# Patient Record
Sex: Female | Born: 1955 | Race: White | Hispanic: No | Marital: Single | Smoking: Never smoker
Health system: Southern US, Community
[De-identification: ages and names within clinical notes are randomized; demographics above are authoritative.]

## PROBLEM LIST (undated history)

## (undated) DIAGNOSIS — T7840XA Allergy, unspecified, initial encounter: Secondary | ICD-10-CM

## (undated) DIAGNOSIS — Z8739 Personal history of other diseases of the musculoskeletal system and connective tissue: Secondary | ICD-10-CM

## (undated) DIAGNOSIS — Z86018 Personal history of other benign neoplasm: Secondary | ICD-10-CM

## (undated) DIAGNOSIS — Z8719 Personal history of other diseases of the digestive system: Secondary | ICD-10-CM

## (undated) HISTORY — PX: ABDOMINAL HYSTERECTOMY: SHX81

## (undated) HISTORY — DX: Personal history of other diseases of the musculoskeletal system and connective tissue: Z87.39

## (undated) HISTORY — DX: Personal history of other benign neoplasm: Z86.018

## (undated) HISTORY — PX: FRACTURE SURGERY: SHX138

## (undated) HISTORY — DX: Personal history of other diseases of the digestive system: Z87.19

## (undated) HISTORY — DX: Allergy, unspecified, initial encounter: T78.40XA

## (undated) HISTORY — PX: ANKLE FRACTURE SURGERY: SHX122

---
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
## 1994-02-02 HISTORY — PX: PARTIAL HYSTERECTOMY: SHX80

## 1994-02-02 HISTORY — PX: BREAST BIOPSY: SHX20

## 2002-08-17 ENCOUNTER — Other Ambulatory Visit: Admission: RE | Admit: 2002-08-17 | Discharge: 2002-08-17 | Payer: Self-pay | Admitting: Family Medicine

## 2004-03-07 ENCOUNTER — Ambulatory Visit: Payer: Self-pay | Admitting: Gastroenterology

## 2008-06-28 ENCOUNTER — Encounter: Admission: RE | Admit: 2008-06-28 | Discharge: 2008-06-28 | Payer: Self-pay | Admitting: Family Medicine

## 2008-06-28 ENCOUNTER — Ambulatory Visit: Payer: Self-pay | Admitting: Family Medicine

## 2008-06-28 DIAGNOSIS — M25519 Pain in unspecified shoulder: Secondary | ICD-10-CM

## 2008-06-28 DIAGNOSIS — K589 Irritable bowel syndrome without diarrhea: Secondary | ICD-10-CM

## 2008-06-29 ENCOUNTER — Encounter: Payer: Self-pay | Admitting: Family Medicine

## 2008-06-29 LAB — CONVERTED CEMR LAB
BUN: 14 mg/dL (ref 6–23)
Basophils Absolute: 0.1 10*3/uL (ref 0.0–0.1)
Basophils Relative: 0.9 % (ref 0.0–3.0)
Bilirubin, Direct: 0 mg/dL (ref 0.0–0.3)
CO2: 31 meq/L (ref 19–32)
Calcium: 9.5 mg/dL (ref 8.4–10.5)
Chloride: 103 meq/L (ref 96–112)
Cholesterol: 198 mg/dL (ref 0–200)
Creatinine, Ser: 0.8 mg/dL (ref 0.4–1.2)
Eosinophils Absolute: 0.2 10*3/uL (ref 0.0–0.7)
HDL: 72.4 mg/dL (ref >39.00)
MCHC: 34.1 g/dL (ref 30.0–36.0)
MCV: 91 fL (ref 78.0–100.0)
Monocytes Absolute: 0.4 10*3/uL (ref 0.1–1.0)
Neutrophils Relative %: 61.7 % (ref 43.0–77.0)
Platelets: 286 10*3/uL (ref 150.0–400.0)
RDW: 12.5 % (ref 11.5–14.6)
Total Bilirubin: 0.9 mg/dL (ref 0.3–1.2)
Total Protein: 7.2 g/dL (ref 6.0–8.3)
Triglycerides: 103 mg/dL (ref 0.0–149.0)

## 2008-07-09 ENCOUNTER — Ambulatory Visit: Payer: Self-pay | Admitting: Family Medicine

## 2008-07-09 DIAGNOSIS — M7500 Adhesive capsulitis of unspecified shoulder: Secondary | ICD-10-CM

## 2008-07-12 ENCOUNTER — Encounter (INDEPENDENT_AMBULATORY_CARE_PROVIDER_SITE_OTHER): Payer: Self-pay | Admitting: *Deleted

## 2008-07-19 ENCOUNTER — Ambulatory Visit: Payer: Self-pay | Admitting: Family Medicine

## 2008-07-19 DIAGNOSIS — Z780 Asymptomatic menopausal state: Secondary | ICD-10-CM | POA: Insufficient documentation

## 2008-07-24 ENCOUNTER — Encounter: Payer: Self-pay | Admitting: Family Medicine

## 2008-08-01 ENCOUNTER — Ambulatory Visit: Payer: Self-pay | Admitting: Family Medicine

## 2011-10-27 ENCOUNTER — Telehealth: Payer: Self-pay | Admitting: Family Medicine

## 2011-10-27 DIAGNOSIS — Z0000 Encounter for general adult medical examination without abnormal findings: Secondary | ICD-10-CM | POA: Insufficient documentation

## 2011-10-27 NOTE — Telephone Encounter (Signed)
Message copied by Judy Pimple on Tue Oct 27, 2011  9:29 PM ------      Message from: Alvina Chou      Created: Thu Oct 22, 2011  4:36 PM      Regarding: Lab orders for, Wednesday 9.25       Patient is scheduled for CPX labs, please order future labs, Thanks , Camelia Eng

## 2011-10-28 ENCOUNTER — Other Ambulatory Visit (INDEPENDENT_AMBULATORY_CARE_PROVIDER_SITE_OTHER): Payer: Self-pay

## 2011-10-28 DIAGNOSIS — Z0000 Encounter for general adult medical examination without abnormal findings: Secondary | ICD-10-CM

## 2011-10-28 LAB — CBC WITH DIFFERENTIAL/PLATELET
Basophils Relative: 0.2 % (ref 0.0–3.0)
Eosinophils Relative: 0.1 % (ref 0.0–5.0)
Hemoglobin: 15.1 g/dL — ABNORMAL HIGH (ref 12.0–15.0)
MCV: 89.4 fl (ref 78.0–100.0)
Monocytes Absolute: 0.5 10*3/uL (ref 0.1–1.0)
Neutrophils Relative %: 81.1 % — ABNORMAL HIGH (ref 43.0–77.0)
RBC: 5.14 Mil/uL — ABNORMAL HIGH (ref 3.87–5.11)
WBC: 11.3 10*3/uL — ABNORMAL HIGH (ref 4.5–10.5)

## 2011-10-28 LAB — COMPREHENSIVE METABOLIC PANEL
Albumin: 4.1 g/dL (ref 3.5–5.2)
Alkaline Phosphatase: 98 U/L (ref 39–117)
BUN: 16 mg/dL (ref 6–23)
Calcium: 9.5 mg/dL (ref 8.4–10.5)
Chloride: 106 mEq/L (ref 96–112)
Glucose, Bld: 123 mg/dL — ABNORMAL HIGH (ref 70–99)
Potassium: 4.9 mEq/L (ref 3.5–5.1)

## 2011-10-28 LAB — LIPID PANEL
Cholesterol: 213 mg/dL — ABNORMAL HIGH (ref 0–200)
Triglycerides: 78 mg/dL (ref 0.0–149.0)

## 2011-11-03 ENCOUNTER — Encounter: Payer: Self-pay | Admitting: *Deleted

## 2011-11-04 ENCOUNTER — Encounter: Payer: Self-pay | Admitting: Family Medicine

## 2011-11-04 ENCOUNTER — Ambulatory Visit (INDEPENDENT_AMBULATORY_CARE_PROVIDER_SITE_OTHER): Payer: BC Managed Care – PPO | Admitting: Family Medicine

## 2011-11-04 VITALS — BP 122/78 | HR 84 | Temp 98.4°F | Ht 62.75 in | Wt 235.2 lb

## 2011-11-04 DIAGNOSIS — Z0000 Encounter for general adult medical examination without abnormal findings: Secondary | ICD-10-CM

## 2011-11-04 DIAGNOSIS — E669 Obesity, unspecified: Secondary | ICD-10-CM

## 2011-11-04 DIAGNOSIS — Z1231 Encounter for screening mammogram for malignant neoplasm of breast: Secondary | ICD-10-CM

## 2011-11-04 DIAGNOSIS — R7309 Other abnormal glucose: Secondary | ICD-10-CM

## 2011-11-04 DIAGNOSIS — R739 Hyperglycemia, unspecified: Secondary | ICD-10-CM

## 2011-11-04 NOTE — Assessment & Plan Note (Signed)
Scheduled annual screening mammogram Nl breast exam today  Encouraged monthly self exams   

## 2011-11-04 NOTE — Assessment & Plan Note (Signed)
Long disc about borderline DM and what to do about it  Rev low glycemic diet and plan for wt loss Exercise also  Will get flu shot at work  Lab and f/u 3 mo for a1c and lipids after lifestyle change

## 2011-11-04 NOTE — Assessment & Plan Note (Signed)
Reviewed health habits including diet and exercise and skin cancer prevention Also reviewed health mt list, fam hx and immunizations  emph imp of wt loss Rev wellness labs in detail

## 2011-11-04 NOTE — Assessment & Plan Note (Signed)
Discussed how this problem influences overall health and the risks it imposes  Reviewed plan for weight loss with lower calorie diet (via better food choices and also portion control or program like weight watchers) and exercise building up to or more than 30 minutes 5 days per week including some aerobic activity    

## 2011-11-04 NOTE — Patient Instructions (Addendum)
We will refer you for a mammogram at check out  Get your flu shot at work Please send to [facility] clinic for last colonoscopy report so I can put it in the chart Avoid red meat/ fried foods/ egg yolks/ fatty breakfast meats/ butter, cheese and high fat dairy/ and shellfish   Also cut back on sugar  Aim for exercise 5 days per week as a goal  Schedule non fasting lab in 3 months and then follow up

## 2011-11-04 NOTE — Progress Notes (Signed)
Subjective:    Patient ID: Gabriella Lawson, female    DOB: Jul 19, 1955, 56 y.o.   MRN: [id number]  HPI Here for health maintenance exam and to review chronic medical problems    Is feeling ok  No new issue   Partial hyst in past - that was for fibroid tumor No abn paps in past   Mammogram-- not in a long time  Self exam- no lumps or changes  No family hx of breast cancer   Colon cancer screening - has had a colonoscopy -- has been a while , more than 5 years -- given 10 year recall    Flu shot-will get free at work next week   Wt is up 57 lb with bmi of 42 Has not weighed herself  Works late hours  Eats late at night - and eats anything she wants  Has a great job- works 60 hours per week - add on 90 min in the car    Wbc ct up 11.3 Does not know why- no fever  Did get a steroid shot for her heel just prior to that   Lab Results  Component Value Date   CHOL 213* 10/28/2011   CHOL 198 06/28/2008   Lab Results  Component Value Date   HDL 68.90 10/28/2011   HDL 69.62 06/28/2008   Lab Results  Component Value Date   LDLCALC 105* 06/28/2008   Lab Results  Component Value Date   TRIG 78.0 10/28/2011   TRIG 103.0 06/28/2008   Lab Results  Component Value Date   CHOLHDL 3 10/28/2011   CHOLHDL 3 06/28/2008   Lab Results  Component Value Date   LDLDIRECT 125.0 10/28/2011   not too bad but needs to change her diet   Sugar 123  Review of Systems Review of Systems  Constitutional: Negative for fever, appetite change, fatigue and unexpected weight change.  Eyes: Negative for pain and visual disturbance.  Respiratory: Negative for cough and shortness of breath.   Cardiovascular: Negative for cp or palpitations    Gastrointestinal: Negative for nausea, diarrhea and constipation.  Genitourinary: Negative for urgency and frequency. neg for excessive thirst Skin: Negative for pallor or rash   Neurological: Negative for weakness, light-headedness, numbness and headaches.    Hematological: Negative for adenopathy. Does not bruise/bleed easily.  Psychiatric/Behavioral: Negative for dysphoric mood. The patient is not nervous/anxious.         Objective:   Physical Exam  Constitutional: She appears well-developed and well-nourished. No distress.       obese and well appearing   HENT:  Head: Normocephalic and atraumatic.  Right Ear: External ear normal.  Left Ear: External ear normal.  Nose: Nose normal.  Mouth/Throat: Oropharynx is clear and moist.  Eyes: Conjunctivae normal and EOM are normal. Pupils are equal, round, and reactive to light. Right eye exhibits no discharge. Left eye exhibits no discharge. No scleral icterus.  Neck: Normal range of motion. Neck supple. No JVD present. Carotid bruit is not present. No thyromegaly present.  Cardiovascular: Normal rate, regular rhythm, normal heart sounds and intact distal pulses.  Exam reveals no gallop.   Pulmonary/Chest: Effort normal and breath sounds normal. No respiratory distress. She has no wheezes.  Abdominal: Soft. Bowel sounds are normal. She exhibits no distension and no abdominal bruit. There is no tenderness.  Genitourinary: No breast swelling, tenderness, discharge or bleeding.       Breast exam: No mass, nodules, thickening, tenderness, bulging, retraction, inflamation, nipple discharge  or skin changes noted.  No axillary or clavicular LA.  Chaperoned exam.    Musculoskeletal: She exhibits no edema and no tenderness.  Lymphadenopathy:    She has no cervical adenopathy.  Neurological: She is alert. She has normal reflexes. No cranial nerve deficit. She exhibits normal muscle tone. Coordination normal.  Skin: Skin is warm and dry. No rash noted. No erythema. No pallor.  Psychiatric: She has a normal mood and affect.          Assessment & Plan:

## 2011-11-19 ENCOUNTER — Ambulatory Visit: Payer: Self-pay | Admitting: Family Medicine

## 2011-11-20 ENCOUNTER — Encounter: Payer: Self-pay | Admitting: Family Medicine

## 2011-11-23 ENCOUNTER — Encounter: Payer: Self-pay | Admitting: *Deleted

## 2012-02-05 ENCOUNTER — Other Ambulatory Visit: Payer: BC Managed Care – PPO

## 2012-02-10 ENCOUNTER — Ambulatory Visit: Payer: BC Managed Care – PPO | Admitting: Family Medicine

## 2012-02-12 ENCOUNTER — Other Ambulatory Visit (INDEPENDENT_AMBULATORY_CARE_PROVIDER_SITE_OTHER): Payer: BC Managed Care – PPO

## 2012-02-12 DIAGNOSIS — R739 Hyperglycemia, unspecified: Secondary | ICD-10-CM

## 2012-02-12 DIAGNOSIS — R7309 Other abnormal glucose: Secondary | ICD-10-CM

## 2012-02-15 LAB — LIPID PANEL
Cholesterol: 180 mg/dL (ref 0–200)
LDL Cholesterol: 105 mg/dL — ABNORMAL HIGH (ref 0–99)
Triglycerides: 64 mg/dL (ref 0.0–149.0)
VLDL: 12.8 mg/dL (ref 0.0–40.0)

## 2012-02-19 ENCOUNTER — Ambulatory Visit (INDEPENDENT_AMBULATORY_CARE_PROVIDER_SITE_OTHER): Payer: BC Managed Care – PPO | Admitting: Family Medicine

## 2012-02-19 ENCOUNTER — Encounter: Payer: Self-pay | Admitting: Family Medicine

## 2012-02-19 VITALS — BP 136/84 | HR 90 | Temp 98.5°F | Ht 62.75 in | Wt 235.2 lb

## 2012-02-19 DIAGNOSIS — E669 Obesity, unspecified: Secondary | ICD-10-CM

## 2012-02-19 DIAGNOSIS — R739 Hyperglycemia, unspecified: Secondary | ICD-10-CM

## 2012-02-19 DIAGNOSIS — R7309 Other abnormal glucose: Secondary | ICD-10-CM

## 2012-02-19 NOTE — Patient Instructions (Addendum)
Cut calories / cut processed foods and starches/ and cut portions by 1/3 sugarbusters is a good book to look at  You are " pre" diabetic and weight loss will stop this  Aim to increase exercise to 5 d per week Follow up in 3 months with labs prior

## 2012-02-19 NOTE — Assessment & Plan Note (Addendum)
Obese without DM symptoms  Disc in detail plan for better eating/ exercise and wt loss  Disc risks for DM and consquences of it-given handouts  Counseled extensively on lifestyle change >25 min spent with face to face with patient, >50% counseling and/or coordinating care   Plan lab and f/u in 3 mo

## 2012-02-19 NOTE — Assessment & Plan Note (Signed)
In pt with prediabetes Discussed how this problem influences overall health and the risks it imposes  Reviewed plan for weight loss with lower calorie diet (via better food choices and also portion control or program like weight watchers) and exercise building up to or more than 30 minutes 5 days per week including some aerobic activity

## 2012-02-19 NOTE — Progress Notes (Signed)
Subjective:    Patient ID: Gabriella Lawson, female    DOB: 1955/03/05, 57 y.o.   MRN: [id number]  HPI Here for f/u of hypergycemia and obesity  Is feeling good   Wt is unchanged with bmi of 42  Hyperglycemia -  Lab Results  Component Value Date   HGBA1C 6.4 02/12/2012    Diet-was not optimal during the holidays - things were tough  Still not making changes now either  Exercise- needs to be more consistent - likes to walk - about 3 days per week - usually 30-45 minutes - walking fast  Patient Active Problem List  Diagnosis  . IRRITABLE BOWEL SYNDROME  . POSTMENOPAUSAL STATUS  . Routine general medical examination at a health care facility  . Obesity  . Other screening mammogram  . Hyperglycemia   Past Medical History  Diagnosis Date  . History of IBS     managed with diet (chronic constipation)  . History of uterine fibroid     had systerectomy  . H/O osteopenia     mild   Past Surgical History  Procedure Date  . Breast biopsy 1996  . Tonsillectomy 1964  . Partial hysterectomy 1996    for fibroids  . Ankle fracture surgery     steel plate/7 screws (fx in 3 places adn dislocated)   History  Substance Use Topics  . Smoking status: Never Smoker   . Smokeless tobacco: Not on file  . Alcohol Use: Yes     Comment: social   Family History  Problem Relation Age of Onset  . Heart attack Paternal Grandfather   . Heart attack Maternal Grandfather   . Osteoporosis Other     grandmother  . Osteoporosis Mother    Allergies  Allergen Reactions  . Etodolac Rash    Red skin that burned   Current Outpatient Prescriptions on File Prior to Visit  Medication Sig Dispense Refill  . aspirin 81 MG tablet Take 81 mg by mouth daily.            Review of Systems Review of Systems  Constitutional: Negative for fever, appetite change, fatigue and unexpected weight change.  Eyes: Negative for pain and visual disturbance.  Respiratory: Negative for cough and shortness of  breath.   Cardiovascular: Negative for cp or palpitations    Gastrointestinal: Negative for nausea, diarrhea and constipation.  Genitourinary: Negative for urgency and frequency. neg for excessive thirst  Skin: Negative for pallor or rash   Neurological: Negative for weakness, light-headedness, numbness and headaches.  Hematological: Negative for adenopathy. Does not bruise/bleed easily.  Psychiatric/Behavioral: Negative for dysphoric mood. The patient is not nervous/anxious.         Objective:   Physical Exam  Constitutional: She appears well-developed and well-nourished. No distress.       obese and well appearing   HENT:  Head: Normocephalic and atraumatic.  Mouth/Throat: Oropharynx is clear and moist.  Eyes: Conjunctivae normal and EOM are normal. Pupils are equal, round, and reactive to light.  Neck: Normal range of motion. Neck supple. No thyromegaly present.  Cardiovascular: Normal rate and regular rhythm.   Pulmonary/Chest: Effort normal and breath sounds normal. No respiratory distress. She has no wheezes.  Musculoskeletal: She exhibits no edema.  Lymphadenopathy:    She has no cervical adenopathy.  Neurological: She is alert. She has normal reflexes. No cranial nerve deficit. She exhibits normal muscle tone. Coordination normal.  Skin: Skin is warm and dry. No rash noted. No pallor.  Psychiatric: She has a normal mood and affect.          Assessment & Plan:

## 2012-05-11 ENCOUNTER — Other Ambulatory Visit: Payer: BC Managed Care – PPO

## 2012-05-12 ENCOUNTER — Other Ambulatory Visit (INDEPENDENT_AMBULATORY_CARE_PROVIDER_SITE_OTHER): Payer: BC Managed Care – PPO

## 2012-05-12 DIAGNOSIS — R7309 Other abnormal glucose: Secondary | ICD-10-CM

## 2012-05-12 DIAGNOSIS — R739 Hyperglycemia, unspecified: Secondary | ICD-10-CM

## 2012-05-18 ENCOUNTER — Ambulatory Visit (INDEPENDENT_AMBULATORY_CARE_PROVIDER_SITE_OTHER): Payer: BC Managed Care – PPO | Admitting: Family Medicine

## 2012-05-18 ENCOUNTER — Encounter: Payer: Self-pay | Admitting: Family Medicine

## 2012-05-18 VITALS — BP 124/76 | HR 80 | Temp 98.5°F | Ht 62.75 in | Wt 223.0 lb

## 2012-05-18 DIAGNOSIS — E669 Obesity, unspecified: Secondary | ICD-10-CM

## 2012-05-18 DIAGNOSIS — R7309 Other abnormal glucose: Secondary | ICD-10-CM

## 2012-05-18 DIAGNOSIS — R739 Hyperglycemia, unspecified: Secondary | ICD-10-CM

## 2012-05-18 NOTE — Progress Notes (Signed)
Subjective:    Patient ID: Gabriella Lawson, female    DOB: [birth year], 56 y.o.   MRN: [id number]  HPI Here for f/u of hyperglycemia and obesity  Last visit a1c was 6.4  Since then she has lost 12 lb Has not weighed - but clothes are fitting better   Diet- has changed that has eliminated most carbohydrates (potato and rice and pasta) , does not eat sweets  She eats too much- will cut portions next , and she does love green vegetables  Exercise- some exercise -is walking - at work / active job - some in the evenings  BMI is now 39  Current A1c down to 6.2- is improved   Patient Active Problem List  Diagnosis  . IRRITABLE BOWEL SYNDROME  . POSTMENOPAUSAL STATUS  . Routine general medical examination at a health care facility  . Obesity  . Other screening mammogram  . Hyperglycemia   Past Medical History  Diagnosis Date  . History of IBS     managed with diet (chronic constipation)  . History of uterine fibroid     had systerectomy  . H/O osteopenia     mild   Past Surgical History  Procedure Laterality Date  . Breast biopsy  1996  . Tonsillectomy  1964  . Partial hysterectomy  1996    for fibroids  . Ankle fracture surgery      steel plate/7 screws (fx in 3 places adn dislocated)   History  Substance Use Topics  . Smoking status: Never Smoker   . Smokeless tobacco: Not on file  . Alcohol Use: Yes     Comment: social   Family History  Problem Relation Age of Onset  . Heart attack Paternal Grandfather   . Heart attack Maternal Grandfather   . Osteoporosis Other     grandmother  . Osteoporosis Mother    Allergies  Allergen Reactions  . Etodolac Rash    Red skin that burned   Current Outpatient Prescriptions on File Prior to Visit  Medication Sig Dispense Refill  . aspirin 81 MG tablet Take 81 mg by mouth daily.       No current facility-administered medications on file prior to visit.      Review of Systems Review of Systems  Constitutional:  Negative for fever, appetite change, fatigue and unexpected weight change.  Eyes: Negative for pain and visual disturbance.  Respiratory: Negative for cough and shortness of breath.   Cardiovascular: Negative for cp or palpitations    Gastrointestinal: Negative for nausea, diarrhea and constipation.  Genitourinary: Negative for urgency and frequency.  Skin: Negative for pallor or rash   Neurological: Negative for weakness, light-headedness, numbness and headaches.  Hematological: Negative for adenopathy. Does not bruise/bleed easily.  Psychiatric/Behavioral: Negative for dysphoric mood. The patient is not nervous/anxious.         Objective:   Physical Exam  Constitutional: She appears well-developed and well-nourished. No distress.  obese and well appearing   HENT:  Head: Normocephalic and atraumatic.  Mouth/Throat: Oropharynx is clear and moist.  Eyes: Conjunctivae and EOM are normal. Pupils are equal, round, and reactive to light.  Neck: Normal range of motion. Neck supple. No thyromegaly present.  Cardiovascular: Normal rate, regular rhythm, normal heart sounds and intact distal pulses.   Pulmonary/Chest: Effort normal and breath sounds normal. No respiratory distress. She has no wheezes.  Musculoskeletal: She exhibits no edema.  Lymphadenopathy:    She has no cervical adenopathy.  Skin: Skin is  warm and dry. No rash noted. No pallor.  Psychiatric: She has a normal mood and affect.          Assessment & Plan:

## 2012-05-18 NOTE — Assessment & Plan Note (Signed)
Improved with weight loss and better diet Lab Results  Component Value Date   HGBA1C 6.2 05/12/2012    Will continue the effort- to decrease risk of DM No symptoms

## 2012-05-18 NOTE — Assessment & Plan Note (Signed)
Doing great with 12 lb loss so far Enc to start exercising and work up to 5 days per week Pt is motivated Wants to walk

## 2012-05-18 NOTE — Patient Instructions (Addendum)
Great job so far with diet/ exercise and weight loss  Aim for 5 days of exercise per week (30 minutes)- work up to it  Schedule annual exam in fall with labs prior

## 2012-11-14 ENCOUNTER — Telehealth: Payer: Self-pay | Admitting: Family Medicine

## 2012-11-14 DIAGNOSIS — R739 Hyperglycemia, unspecified: Secondary | ICD-10-CM

## 2012-11-14 DIAGNOSIS — Z0000 Encounter for general adult medical examination without abnormal findings: Secondary | ICD-10-CM

## 2012-11-14 NOTE — Telephone Encounter (Signed)
Message copied by Judy Pimple on Mon Nov 14, 2012  5:48 PM ------      Message from: Alvina Chou      Created: Fri Nov 04, 2012  1:05 PM      Regarding: Lab orders for Tuesday, 10.14.14       Patient is scheduled for CPX labs, please order future labs, Thanks , Terri       ------

## 2012-11-15 ENCOUNTER — Other Ambulatory Visit (INDEPENDENT_AMBULATORY_CARE_PROVIDER_SITE_OTHER): Payer: BC Managed Care – PPO

## 2012-11-15 DIAGNOSIS — Z0000 Encounter for general adult medical examination without abnormal findings: Secondary | ICD-10-CM

## 2012-11-15 DIAGNOSIS — R7309 Other abnormal glucose: Secondary | ICD-10-CM

## 2012-11-15 DIAGNOSIS — R739 Hyperglycemia, unspecified: Secondary | ICD-10-CM

## 2012-11-15 DIAGNOSIS — E669 Obesity, unspecified: Secondary | ICD-10-CM

## 2012-11-15 LAB — LIPID PANEL
Cholesterol: 162 mg/dL (ref 0–200)
HDL: 57.4 mg/dL (ref >39.00)
LDL Cholesterol: 89 mg/dL (ref 0–99)
VLDL: 15.4 mg/dL (ref 0.0–40.0)

## 2012-11-15 LAB — CBC WITH DIFFERENTIAL/PLATELET
Basophils Relative: 0.4 % (ref 0.0–3.0)
Eosinophils Absolute: 0.2 10*3/uL (ref 0.0–0.7)
MCHC: 32.8 g/dL (ref 30.0–36.0)
MCV: 88.6 fl (ref 78.0–100.0)
Monocytes Absolute: 0.5 10*3/uL (ref 0.1–1.0)
Neutrophils Relative %: 68.5 % (ref 43.0–77.0)
Platelets: 299 10*3/uL (ref 150.0–400.0)

## 2012-11-15 LAB — COMPREHENSIVE METABOLIC PANEL
AST: 18 U/L (ref 0–37)
Albumin: 3.9 g/dL (ref 3.5–5.2)
Alkaline Phosphatase: 77 U/L (ref 39–117)
BUN: 14 mg/dL (ref 6–23)
Potassium: 5.4 mEq/L — ABNORMAL HIGH (ref 3.5–5.1)
Sodium: 144 mEq/L (ref 135–145)

## 2012-11-22 ENCOUNTER — Encounter: Payer: Self-pay | Admitting: Family Medicine

## 2012-11-22 ENCOUNTER — Ambulatory Visit (INDEPENDENT_AMBULATORY_CARE_PROVIDER_SITE_OTHER): Payer: BC Managed Care – PPO | Admitting: Family Medicine

## 2012-11-22 VITALS — BP 118/78 | HR 93 | Temp 98.2°F | Ht 63.0 in | Wt 211.8 lb

## 2012-11-22 DIAGNOSIS — E669 Obesity, unspecified: Secondary | ICD-10-CM

## 2012-11-22 DIAGNOSIS — Z0000 Encounter for general adult medical examination without abnormal findings: Secondary | ICD-10-CM

## 2012-11-22 DIAGNOSIS — R7309 Other abnormal glucose: Secondary | ICD-10-CM

## 2012-11-22 DIAGNOSIS — R739 Hyperglycemia, unspecified: Secondary | ICD-10-CM

## 2012-11-22 DIAGNOSIS — E875 Hyperkalemia: Secondary | ICD-10-CM

## 2012-11-22 LAB — POTASSIUM: Potassium: 4.6 mEq/L (ref 3.5–5.1)

## 2012-11-22 NOTE — Assessment & Plan Note (Signed)
Lab Results  Component Value Date   HGBA1C 6.3 11/15/2012    Stable -and still pre diabetic Pt is working hard on low glycemic diet and wt loss - commended  Will continue to follow

## 2012-11-22 NOTE — Assessment & Plan Note (Signed)
Lab Results  Component Value Date   K 5.4* 11/15/2012   ? If this was hemolyzed , no reason for it  Repeat today

## 2012-11-22 NOTE — Assessment & Plan Note (Signed)
Discussed how this problem influences overall health and the risks it imposes  Reviewed plan for weight loss with lower calorie diet (via better food choices and also portion control or program like weight watchers) and exercise building up to or more than 30 minutes 5 days per week including some aerobic activity   Commended on good work so far-doing great!

## 2012-11-22 NOTE — Progress Notes (Signed)
Subjective:    Patient ID: Gabriella Lawson, female    DOB: 1955-11-17, 57 y.o.   MRN: [id number]  HPI Here for health maintenance exam and to review chronic medical problems   Doing well overall -no complaints   Wt is down 12 lb with bmi of 37 Diet- cutting back portions and avoiding junk - eating less carbs (total 24 lb lost- happy about that)  Exercise - some walking   Colon cancer screen- had one in the past - no issues - was about 2007 No bowel changes   Pap 9/09 Had a partial hysterectomy for fibroid  No gyn problems at all    Flu vaccine - just had one oct 9- at work   Mammogram 10/13 - [place]  No lumps on self exam  No family hx of breast cancer   Td 1/06  In labs-high K    Chemistry      Component Value Date/Time   NA 144 11/15/2012 0732   K 5.4* 11/15/2012 0732   CL 107 11/15/2012 0732   CO2 30 11/15/2012 0732   BUN 14 11/15/2012 0732   CREATININE 0.8 11/15/2012 0732      Component Value Date/Time   CALCIUM 9.3 11/15/2012 0732   ALKPHOS 77 11/15/2012 0732   AST 18 11/15/2012 0732   ALT 18 11/15/2012 0732   BILITOT 0.4 11/15/2012 0732      Hyperglycemia- fairly stable Lab Results  Component Value Date   HGBA1C 6.3 11/15/2012     Lipids: Lab Results  Component Value Date   CHOL 162 11/15/2012   CHOL 180 02/12/2012   CHOL 213* 10/28/2011   Lab Results  Component Value Date   HDL 57.40 11/15/2012   HDL 62.00 02/12/2012   HDL 40.98 10/28/2011   Lab Results  Component Value Date   LDLCALC 89 11/15/2012   LDLCALC 105* 02/12/2012   LDLCALC 105* 06/28/2008   Lab Results  Component Value Date   TRIG 77.0 11/15/2012   TRIG 64.0 02/12/2012   TRIG 78.0 10/28/2011   Lab Results  Component Value Date   CHOLHDL 3 11/15/2012   CHOLHDL 3 02/12/2012   CHOLHDL 3 10/28/2011   Lab Results  Component Value Date   LDLDIRECT 125.0 10/28/2011   looking really good - with diet change  Mood -- is pretty good / no depression   Not taking calcium or D Has  had a broken ankle in the past in 2008 - plate and screws in it     Review of Systems Review of Systems  Constitutional: Negative for fever, appetite change, fatigue and unexpected weight change.  Eyes: Negative for pain and visual disturbance.  Respiratory: Negative for cough and shortness of breath.   Cardiovascular: Negative for cp or palpitations    Gastrointestinal: Negative for nausea, diarrhea and constipation.  Genitourinary: Negative for urgency and frequency.  Skin: Negative for pallor or rash   Neurological: Negative for weakness, light-headedness, numbness and headaches.  Hematological: Negative for adenopathy. Does not bruise/bleed easily.  Psychiatric/Behavioral: Negative for dysphoric mood. The patient is not nervous/anxious.         Objective:   Physical Exam  Constitutional: She appears well-developed and well-nourished. No distress.  obese and well appearing   HENT:  Head: Normocephalic and atraumatic.  Right Ear: External ear normal.  Left Ear: External ear normal.  Nose: Nose normal.  Mouth/Throat: Oropharynx is clear and moist.  Eyes: Conjunctivae and EOM are normal. Pupils are equal, round, and  reactive to light. Right eye exhibits no discharge. Left eye exhibits no discharge. No scleral icterus.  Neck: Normal range of motion. Neck supple. No JVD present. Carotid bruit is not present. No thyromegaly present.  Cardiovascular: Normal rate, regular rhythm, normal heart sounds and intact distal pulses.  Exam reveals no gallop.   Pulmonary/Chest: Effort normal and breath sounds normal. No respiratory distress. She has no wheezes. She has no rales.  Abdominal: Soft. Bowel sounds are normal. She exhibits no distension and no mass. There is no tenderness.  Genitourinary: No breast swelling, tenderness, discharge or bleeding.  Breast exam: No mass, nodules, thickening, tenderness, bulging, retraction, inflamation, nipple discharge or skin changes noted.  No axillary or  clavicular LA.  Chaperoned exam.    Musculoskeletal: She exhibits no edema and no tenderness.  Lymphadenopathy:    She has no cervical adenopathy.  Neurological: She is alert. She has normal reflexes. No cranial nerve deficit. She exhibits normal muscle tone. Coordination normal.  Skin: Skin is warm and dry. No rash noted. No erythema. No pallor.  Dry skin  Psychiatric: She has a normal mood and affect.          Assessment & Plan:

## 2012-11-22 NOTE — Assessment & Plan Note (Signed)
Reviewed health habits including diet and exercise and skin cancer prevention Also reviewed health mt list, fam hx and immunizations  Commended on wt loss She will schedule her own mammogram at [facility]

## 2012-11-22 NOTE — Patient Instructions (Signed)
Try to get 1200-1500 mg of calcium per day with at least 1000 iu of vitamin D - for bone health  Re checking potassium level today  Keep working on healthy diet and try to exercise 5 days a week if possible (work up to it)

## 2012-11-23 ENCOUNTER — Encounter: Payer: Self-pay | Admitting: *Deleted

## 2012-12-09 ENCOUNTER — Ambulatory Visit: Payer: Self-pay | Admitting: Family Medicine

## 2012-12-09 ENCOUNTER — Encounter: Payer: Self-pay | Admitting: Family Medicine

## 2012-12-12 IMAGING — MG MM CAD SCREENING MAMMO
1 series · 4 of 4 positions shown · non-contrast
Comparison: none

REASON FOR EXAM: SCR MAMMO NO ORDER
COMMENTS:

PROCEDURE:     MAM - MAM DGTL SCRN MAM NO ORDER W/CAD  - November 19, 2011  [DATE]
RESULT:     Breast are moderately dense and nodular. CAD evaluation is
nonfocal. Exam stable from prior exam.

[R CC · right · 4 of 4 slices shown]
[im 1/4]
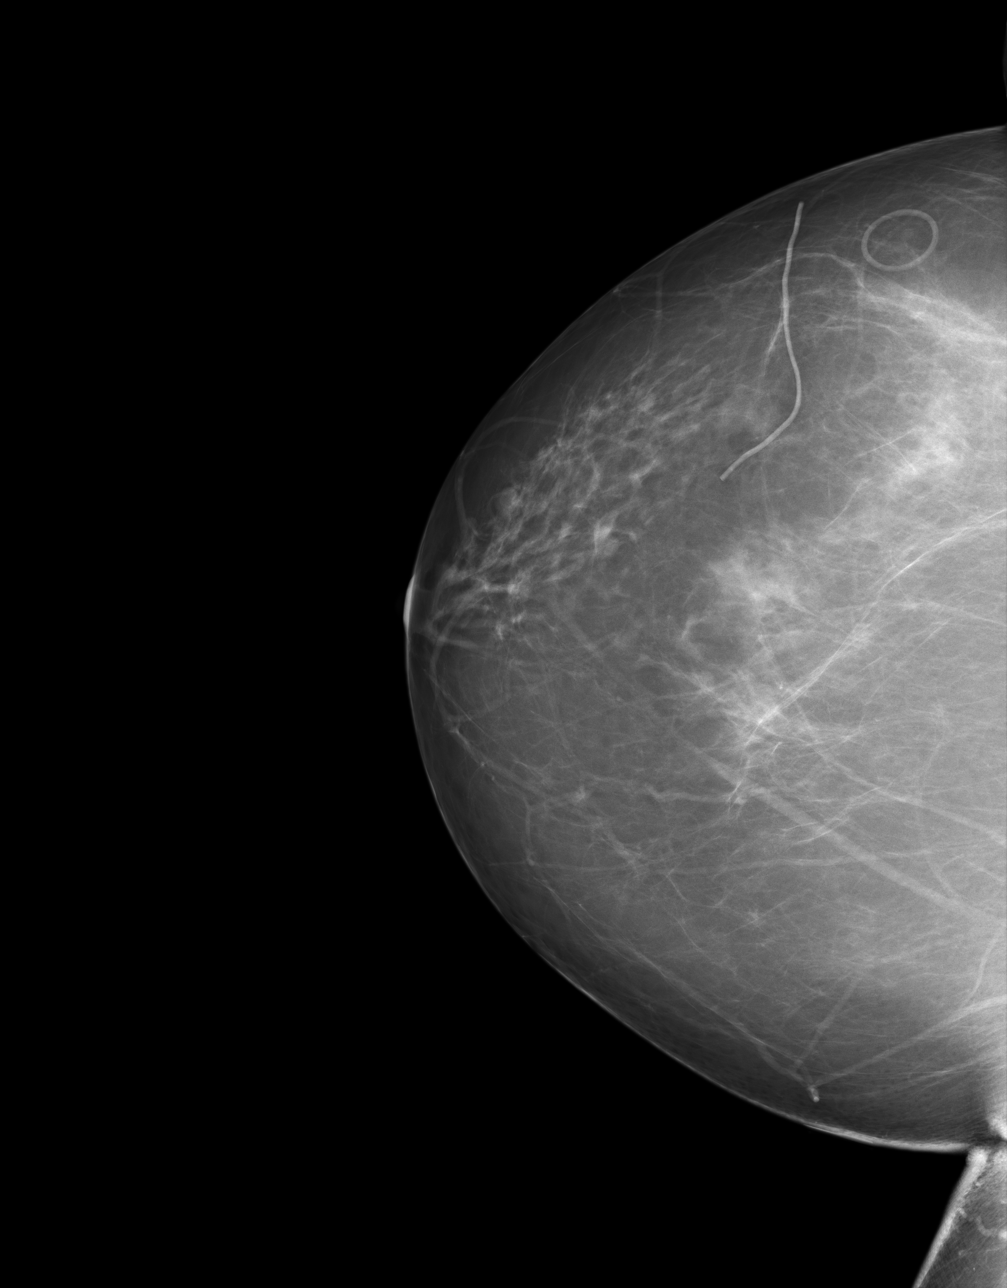
[im 2/4]
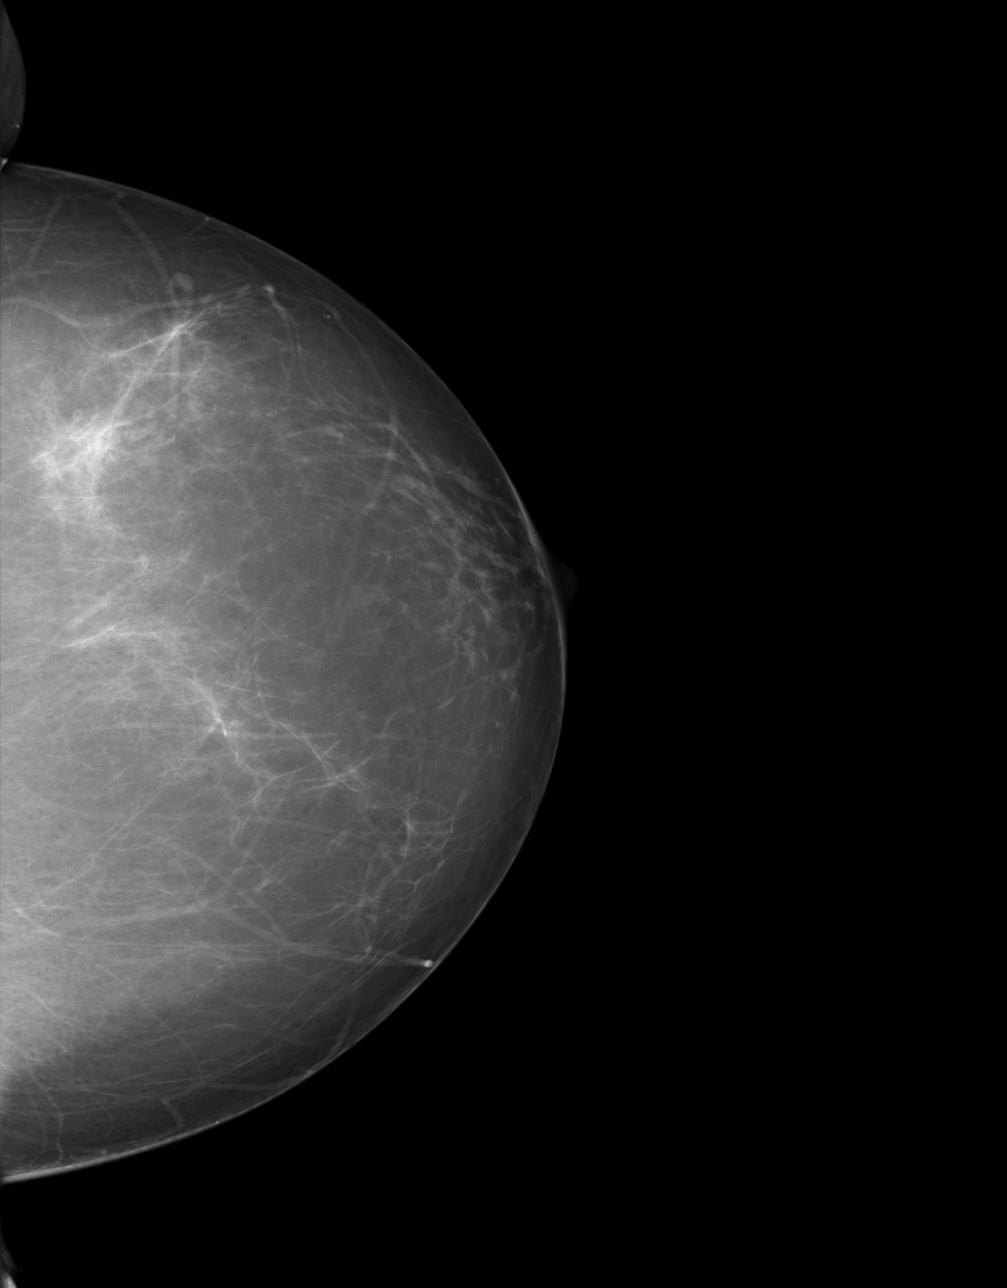
[im 3/4]
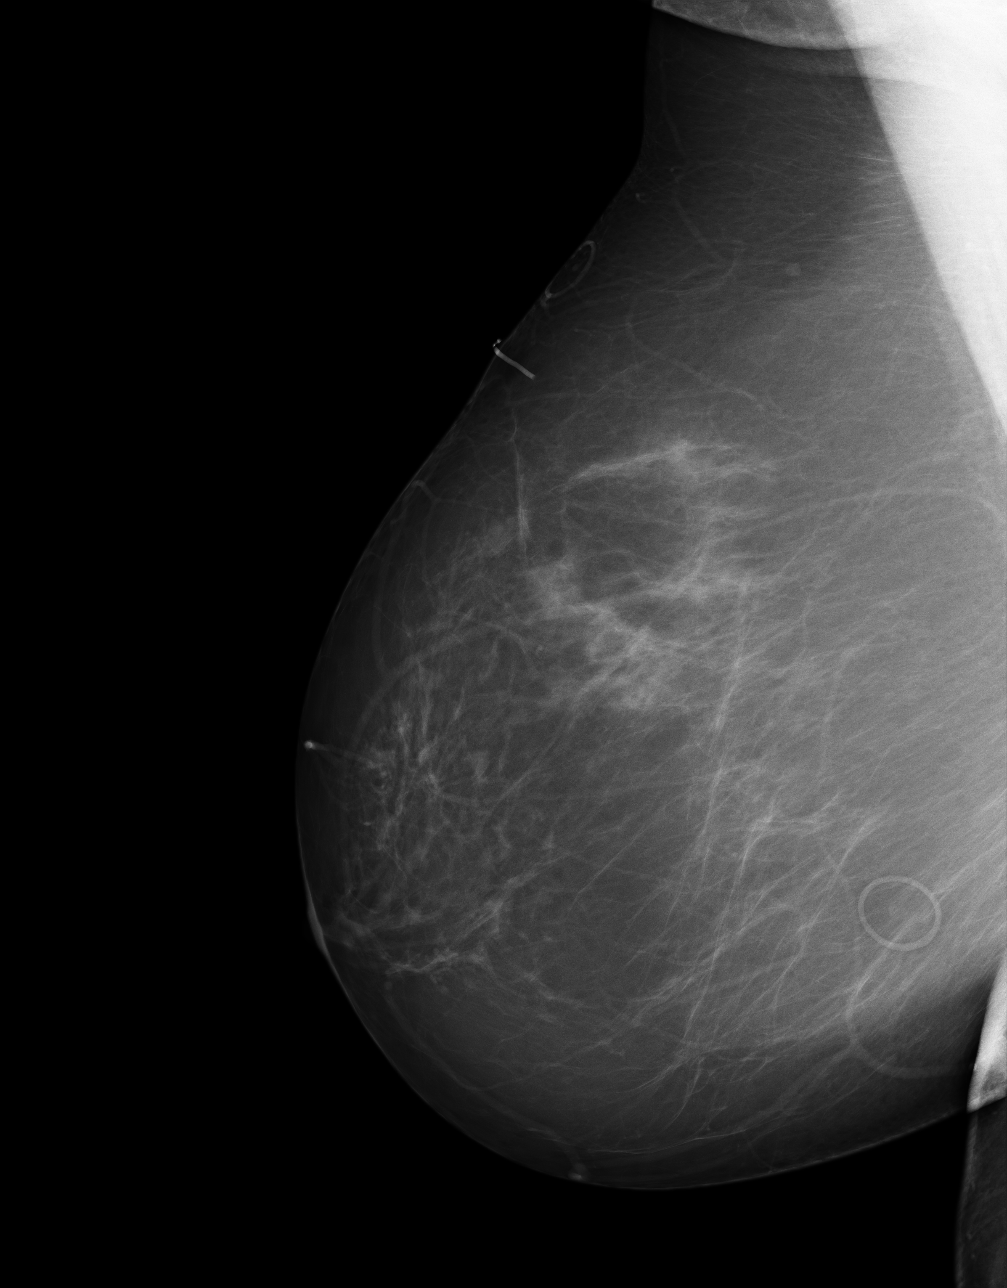
[im 4/4]
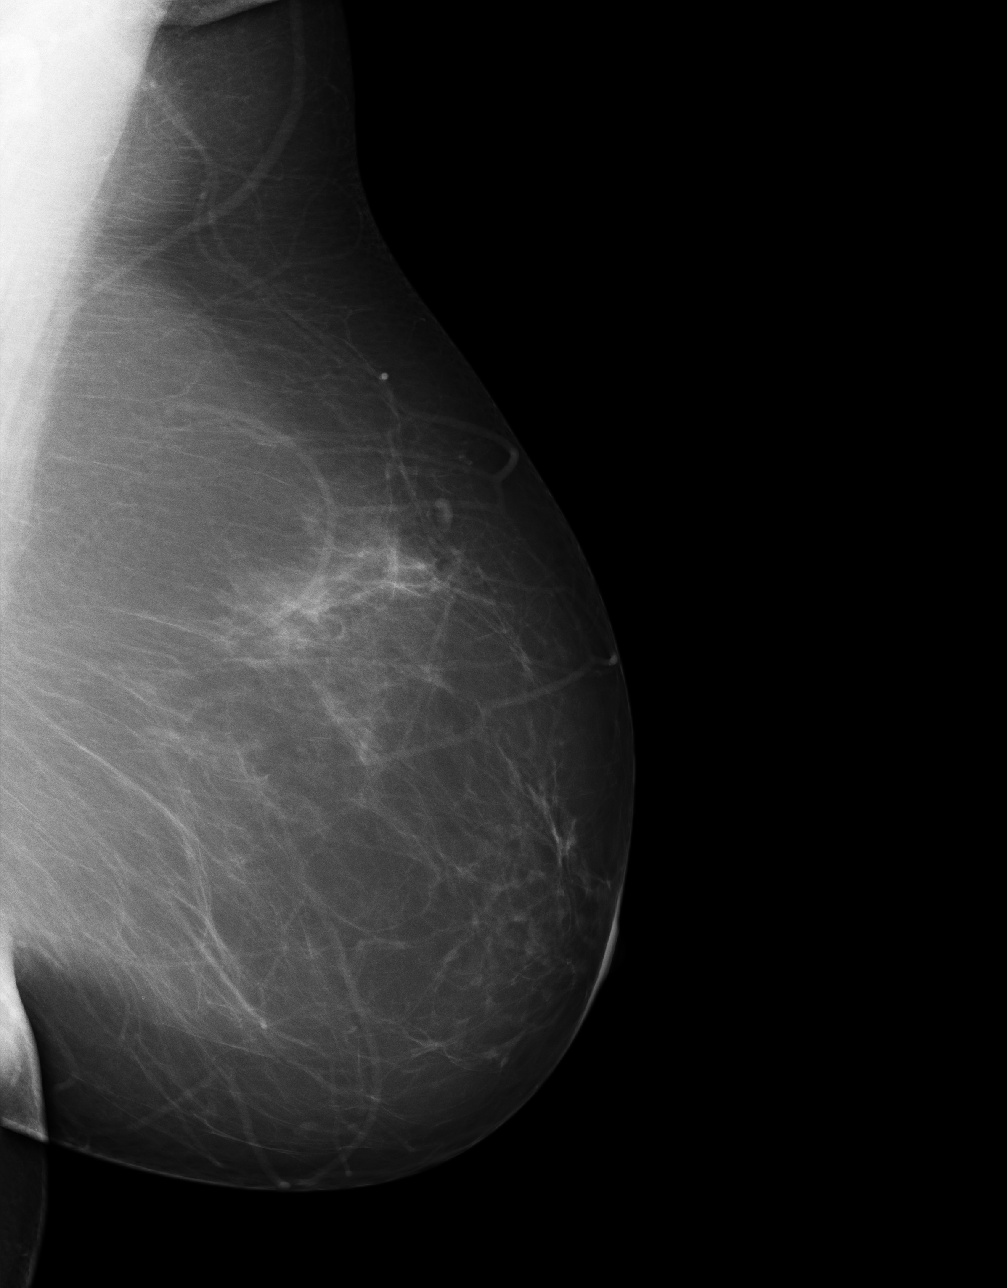

[4 of 4 positions shown; findings below may reference images not displayed]

IMPRESSION: Stable benign exam.

BI-RADS: Category 2- Benign Finding

A NEGATIVE MAMMOGRAM REPORT DOES NOT PRECLUDE BIOPSY OR OTHER EVALUATION OF
A CLINICALLY PALPABLE OR OTHERWISE SUSPICIOUS MASS OR LESION. BREAST CANCER
MAY NOT BE DETECTED IN UP TO 10% OF CASES.

## 2013-04-12 ENCOUNTER — Ambulatory Visit (INDEPENDENT_AMBULATORY_CARE_PROVIDER_SITE_OTHER): Payer: BC Managed Care – PPO | Admitting: Family Medicine

## 2013-04-12 ENCOUNTER — Encounter: Payer: Self-pay | Admitting: Family Medicine

## 2013-04-12 VITALS — BP 128/68 | HR 75 | Temp 98.4°F | Ht 63.0 in | Wt 209.5 lb

## 2013-04-12 DIAGNOSIS — G5612 Other lesions of median nerve, left upper limb: Secondary | ICD-10-CM

## 2013-04-12 DIAGNOSIS — G5610 Other lesions of median nerve, unspecified upper limb: Secondary | ICD-10-CM

## 2013-04-12 NOTE — Progress Notes (Signed)
Date:  04/12/2013   Name:  Gabriella Lawson   DOB:  Oct 11, 1955   MRN:  [id number]  Primary Physician:  Roxy Manns, MD   Chief Complaint: Numbness   Subjective:   History of Present Illness:  Gabriella Lawson is a 58 y.o. pleasant patient who presents with the following:  No pain, about a week ago, for elbow down to her fingers, will feel like has some pins and needles. All on the left side. No trauma or injury. No history of cervical spine injury. No old shoulder injuries.   Works at desk all day with arms bent.  Rehab reviewed  Pronator / median nerve entrapment   Patient Active Problem List   Diagnosis Date Noted  . Hyperkalemia 11/22/2012  . Obesity 11/04/2011  . Other screening mammogram 11/04/2011  . Hyperglycemia 11/04/2011  . Routine general medical examination at a health care facility 10/27/2011  . POSTMENOPAUSAL STATUS 07/19/2008  . IRRITABLE BOWEL SYNDROME 06/28/2008    Past Medical History  Diagnosis Date  . History of IBS     managed with diet (chronic constipation)  . History of uterine fibroid     had systerectomy  . H/O osteopenia     mild    Past Surgical History  Procedure Laterality Date  . Breast biopsy  1996  . Tonsillectomy  1964  . Partial hysterectomy  1996    for fibroids  . Ankle fracture surgery      steel plate/7 screws (fx in 3 places adn dislocated)    History   Social History  . Marital Status: Married    Spouse Name: N/A    Number of Children: N/A  . Years of Education: N/A   Occupational History  . Not on file.   Social History Main Topics  . Smoking status: Never Smoker   . Smokeless tobacco: Not on file  . Alcohol Use: Yes     Comment: social  . Drug Use: No  . Sexual Activity: Not on file   Other Topics Concern  . Not on file   Social History Narrative  . No narrative on file    Family History  Problem Relation Age of Onset  . Heart attack Paternal Grandfather   . Heart attack Maternal Grandfather    . Osteoporosis Other     grandmother  . Osteoporosis Mother     Allergies  Allergen Reactions  . Etodolac Rash    Red skin that burned    Medication list has been reviewed and updated.  Review of Systems:  GEN: No fevers, chills. Nontoxic. Primarily MSK c/o today. MSK: Detailed in the HPI GI: tolerating PO intake without difficulty Neuro: as above Otherwise the pertinent positives of the ROS are noted above.   Objective:   Physical Examination: BP 128/68  Pulse 75  Temp(Src) 98.4 F (36.9 C) (Oral)  Ht 5\' 3"  (1.6 m)  Wt 209 lb 8 oz (95.029 kg)  BMI 37.12 kg/m2  SpO2 96%  Ideal Body Weight: Weight in (lb) to have BMI = 25: 140.8   GEN: WDWN, NAD, Non-toxic, Alert & Oriented x 3 HEENT: Atraumatic, Normocephalic.  Ears and Nose: No external deformity. EXTR: No clubbing/cyanosis/edema NEURO: Normal gait.  PSYCH: Normally interactive. Conversant. Not depressed or anxious appearing.  Calm demeanor.   L elbow Ecchymosis or edema: neg ROM: full flexion, extension, pronation, supination Shoulder ROM: Full Flexion: 5/5 Extension: 5/5 Supination: 5/5  Pronation: 5/5 Wrist ext: 5/5 Wrist flexion: 5/5 No  gross bony abnormality Varus and Valgus stress: stable ECRB tenderness: neg Medial epicondyle: NT Lateral epicondyle, resisted wrist extension from wrist full pronation and flexion: NT grip: 5/5  sensation - slightly decreased at middle finger grossly Tinel's, Elbow, hands: negative   Phalens neg bil  CERVICAL SPINE EXAM Range of motion: Flexion, extension, lateral bending, and rotation: full Pain with terminal motion: no Spinous Processes: NT SCM: NT Upper paracervical muscles: nt Upper traps: NT C5-T1 intact, sensation and motor (except as above)  No results found.  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 No CTS, no radial tunnel syndrome.   More  likely pronator from elbow at work. Work changes reviewed and rehab.  Signed,  Elpidio GaleaSpencer T. Hollie Bartus, MD, CAQ Sports Medicine  [facility] [address] [place] [address] Phone: [phone] Fax: [phone]  There are no Patient Instructions on file for this visit.  Patient's Medications  New Prescriptions   No medications on file  Previous Medications   ASPIRIN 81 MG TABLET    Take 81 mg by mouth daily.  Modified Medications   No medications on file  Discontinued Medications   No medications on file

## 2013-04-12 NOTE — Progress Notes (Signed)
Pre visit review using our clinic review tool, if applicable. No additional management support is needed unless otherwise documented below in the visit note. 

## 2013-11-17 ENCOUNTER — Telehealth: Payer: Self-pay | Admitting: Family Medicine

## 2013-11-17 ENCOUNTER — Other Ambulatory Visit (INDEPENDENT_AMBULATORY_CARE_PROVIDER_SITE_OTHER): Payer: BC Managed Care – PPO

## 2013-11-17 DIAGNOSIS — R739 Hyperglycemia, unspecified: Secondary | ICD-10-CM

## 2013-11-17 DIAGNOSIS — Z0000 Encounter for general adult medical examination without abnormal findings: Secondary | ICD-10-CM

## 2013-11-17 LAB — CBC WITH DIFFERENTIAL/PLATELET
BASOS PCT: 0.2 % (ref 0.0–3.0)
Basophils Absolute: 0 10*3/uL (ref 0.0–0.1)
EOS PCT: 2 % (ref 0.0–5.0)
Eosinophils Absolute: 0.1 10*3/uL (ref 0.0–0.7)
HCT: 46.1 % — ABNORMAL HIGH (ref 36.0–46.0)
HEMOGLOBIN: 15.1 g/dL — AB (ref 12.0–15.0)
LYMPHS PCT: 25.5 % (ref 12.0–46.0)
Lymphs Abs: 1.6 10*3/uL (ref 0.7–4.0)
MCHC: 32.7 g/dL (ref 30.0–36.0)
MCV: 89.4 fl (ref 78.0–100.0)
Monocytes Absolute: 0.5 10*3/uL (ref 0.1–1.0)
Monocytes Relative: 7.3 % (ref 3.0–12.0)
NEUTROS ABS: 4.2 10*3/uL (ref 1.4–7.7)
NEUTROS PCT: 65 % (ref 43.0–77.0)
Platelets: 287 10*3/uL (ref 150.0–400.0)
RBC: 5.15 Mil/uL — AB (ref 3.87–5.11)
RDW: 13.8 % (ref 11.5–15.5)
WBC: 6.4 10*3/uL (ref 4.0–10.5)

## 2013-11-17 LAB — COMPREHENSIVE METABOLIC PANEL
ALBUMIN: 3.6 g/dL (ref 3.5–5.2)
ALT: 25 U/L (ref 0–35)
AST: 20 U/L (ref 0–37)
Alkaline Phosphatase: 80 U/L (ref 39–117)
BUN: 14 mg/dL (ref 6–23)
CALCIUM: 9.5 mg/dL (ref 8.4–10.5)
CHLORIDE: 107 meq/L (ref 96–112)
CO2: 28 meq/L (ref 19–32)
Creatinine, Ser: 0.8 mg/dL (ref 0.4–1.2)
GFR: 78.56 mL/min (ref >60.00)
GLUCOSE: 102 mg/dL — AB (ref 70–99)
POTASSIUM: 5 meq/L (ref 3.5–5.1)
Sodium: 143 mEq/L (ref 135–145)
Total Bilirubin: 0.4 mg/dL (ref 0.2–1.2)
Total Protein: 7.3 g/dL (ref 6.0–8.3)

## 2013-11-17 LAB — LIPID PANEL
CHOLESTEROL: 189 mg/dL (ref 0–200)
HDL: 50.5 mg/dL (ref >39.00)
LDL CALC: 123 mg/dL — AB (ref 0–99)
NonHDL: 138.5
Total CHOL/HDL Ratio: 4
Triglycerides: 78 mg/dL (ref 0.0–149.0)
VLDL: 15.6 mg/dL (ref 0.0–40.0)

## 2013-11-17 LAB — TSH: TSH: 1.61 u[IU]/mL (ref 0.35–4.50)

## 2013-11-17 LAB — HEMOGLOBIN A1C: HEMOGLOBIN A1C: 6.1 % (ref 4.6–6.5)

## 2013-11-17 NOTE — Telephone Encounter (Signed)
Message copied by Judy PimpleWER, MARNE A on Fri Nov 17, 2013  7:39 AM ------      Message from: Alvina ChouWALSH, TERRI J      Created: Thu Nov 09, 2013  3:50 PM      Regarding: Lab orders for Friday, 10.16.15       Patient is scheduled for CPX labs, please order future labs, Thanks , Terri       ------

## 2013-11-24 ENCOUNTER — Ambulatory Visit (INDEPENDENT_AMBULATORY_CARE_PROVIDER_SITE_OTHER): Payer: BC Managed Care – PPO | Admitting: Family Medicine

## 2013-11-24 ENCOUNTER — Encounter: Payer: Self-pay | Admitting: Family Medicine

## 2013-11-24 VITALS — BP 118/78 | HR 71 | Temp 98.9°F | Ht 62.75 in | Wt 209.0 lb

## 2013-11-24 DIAGNOSIS — L989 Disorder of the skin and subcutaneous tissue, unspecified: Secondary | ICD-10-CM

## 2013-11-24 DIAGNOSIS — E785 Hyperlipidemia, unspecified: Secondary | ICD-10-CM | POA: Insufficient documentation

## 2013-11-24 DIAGNOSIS — R739 Hyperglycemia, unspecified: Secondary | ICD-10-CM

## 2013-11-24 DIAGNOSIS — Z0000 Encounter for general adult medical examination without abnormal findings: Secondary | ICD-10-CM

## 2013-11-24 DIAGNOSIS — E782 Mixed hyperlipidemia: Secondary | ICD-10-CM | POA: Insufficient documentation

## 2013-11-24 DIAGNOSIS — E669 Obesity, unspecified: Secondary | ICD-10-CM

## 2013-11-24 NOTE — Assessment & Plan Note (Signed)
Up from last check  Disc goals for lipids and reasons to control them Rev labs with pt Rev low sat fat diet in detail

## 2013-11-24 NOTE — Assessment & Plan Note (Signed)
Lab Results  Component Value Date   HGBA1C 6.1 11/17/2013   Improved Enc to continue working on low glycemic diet and wt loss

## 2013-11-24 NOTE — Assessment & Plan Note (Signed)
Raised lesion with wart like texture-cannot r/o squamous cell lesion Ref to derm for eval and tx

## 2013-11-24 NOTE — Assessment & Plan Note (Signed)
Discussed how this problem influences overall health and the risks it imposes  Reviewed plan for weight loss with lower calorie diet (via better food choices and also portion control or program like weight watchers) and exercise building up to or more than 30 minutes 5 days per week including some aerobic activity    

## 2013-11-24 NOTE — Progress Notes (Signed)
Subjective:    Patient ID: Gabriella Lawson, female    DOB: 24-Aug-1955, 58 y.o.   MRN: [id number]  HPI Here for health maintenance exam and to review chronic medical problems    Doing well overall  Has a place on her face / spot that came up - wants to check it   Wt is stable with bmi of 37 Obese  Taking care of herself  She exercises - walks (at work)  Diet is good - no sweets / stays away from diet soda also , working on reducing artificial sweetner   Pap 09 nl  Had hysterectomy Partial  No gyn symptoms   Flu shot- got at work 10/8  Td 06  colonosc 1/07- was fine/ neg 10 year recall   Mammogram 11/14-already has this scheduled in Nov  Self exam-no lumps   Hyperglycemia Lab Results  Component Value Date   HGBA1C 6.1 11/17/2013   improved -diet is better and also she is elim artificial sweetners  Down from 6.3  Lipids Lab Results  Component Value Date   CHOL 189 11/17/2013   CHOL 162 11/15/2012   CHOL 180 02/12/2012   Lab Results  Component Value Date   HDL 50.50 11/17/2013   HDL 57.40 11/15/2012   HDL 62.00 02/12/2012   Lab Results  Component Value Date   LDLCALC 123* 11/17/2013   LDLCALC 89 11/15/2012   LDLCALC 105* 02/12/2012   Lab Results  Component Value Date   TRIG 78.0 11/17/2013   TRIG 77.0 11/15/2012   TRIG 64.0 02/12/2012   Lab Results  Component Value Date   CHOLHDL 4 11/17/2013   CHOLHDL 3 11/15/2012   CHOLHDL 3 02/12/2012   Lab Results  Component Value Date   LDLDIRECT 125.0 10/28/2011   she does eat red meat  No fried foods  No bacon / sausage Avoids cheese - not often   Patient Active Problem List   Diagnosis Date Noted  . Hyperkalemia 11/22/2012  . Obesity 11/04/2011  . Other screening mammogram 11/04/2011  . Hyperglycemia 11/04/2011  . Routine general medical examination at a health care facility 10/27/2011  . POSTMENOPAUSAL STATUS 07/19/2008  . IRRITABLE BOWEL SYNDROME 06/28/2008   Past Medical History  Diagnosis Date    . History of IBS     managed with diet (chronic constipation)  . History of uterine fibroid     had systerectomy  . H/O osteopenia     mild   Past Surgical History  Procedure Laterality Date  . Breast biopsy  1996  . Tonsillectomy  1964  . Partial hysterectomy  1996    for fibroids  . Ankle fracture surgery      steel plate/7 screws (fx in 3 places adn dislocated)   History  Substance Use Topics  . Smoking status: Never Smoker   . Smokeless tobacco: Not on file  . Alcohol Use: Yes     Comment: social   Family History  Problem Relation Age of Onset  . Heart attack Paternal Grandfather   . Heart attack Maternal Grandfather   . Osteoporosis Other     grandmother  . Osteoporosis Mother    Allergies  Allergen Reactions  . Etodolac Rash    Red skin that burned   Current Outpatient Prescriptions on File Prior to Visit  Medication Sig Dispense Refill  . aspirin 81 MG tablet Take 81 mg by mouth daily.       No current facility-administered medications on file prior  to visit.      Review of Systems Review of Systems  Constitutional: Negative for fever, appetite change, fatigue and unexpected weight change.  Eyes: Negative for pain and visual disturbance.  Respiratory: Negative for cough and shortness of breath.   Cardiovascular: Negative for cp or palpitations    Gastrointestinal: Negative for nausea, diarrhea and constipation.  Genitourinary: Negative for urgency and frequency.  Skin: Negative for pallor or rash  pos for lesion on face  Neurological: Negative for weakness, light-headedness, numbness and headaches.  Hematological: Negative for adenopathy. Does not bruise/bleed easily.  Psychiatric/Behavioral: Negative for dysphoric mood. The patient is not nervous/anxious.         Objective:   Physical Exam  Constitutional: She appears well-developed and well-nourished. No distress.  obese and well appearing   HENT:  Head: Normocephalic and atraumatic.  Right  Ear: External ear normal.  Left Ear: External ear normal.  Nose: Nose normal.  Mouth/Throat: Oropharynx is clear and moist.  Eyes: Conjunctivae and EOM are normal. Pupils are equal, round, and reactive to light. Right eye exhibits no discharge. Left eye exhibits no discharge. No scleral icterus.  Neck: Normal range of motion. Neck supple. No JVD present. Carotid bruit is not present. No thyromegaly present.  Cardiovascular: Normal rate, regular rhythm, normal heart sounds and intact distal pulses.  Exam reveals no gallop.   Pulmonary/Chest: Effort normal and breath sounds normal. No respiratory distress. She has no wheezes. She has no rales.  Abdominal: Soft. Bowel sounds are normal. She exhibits no distension and no mass. There is no tenderness.  Genitourinary: No breast swelling, tenderness, discharge or bleeding.  Breast exam: No mass, nodules, thickening, tenderness, bulging, retraction, inflamation, nipple discharge or skin changes noted.  No axillary or clavicular LA.      Musculoskeletal: She exhibits no edema and no tenderness.  Lymphadenopathy:    She has no cervical adenopathy.  Neurological: She is alert. She has normal reflexes. No cranial nerve deficit. She exhibits normal muscle tone. Coordination normal.  Skin: Skin is warm and dry. No rash noted. No erythema. No pallor.  Small 2-3 mm lesion on face -slt raised and keratotic   Psychiatric: She has a normal mood and affect.          Assessment & Plan:   Problem List Items Addressed This Visit     Musculoskeletal and Integument   Skin lesion of face     Raised lesion with wart like texture-cannot r/o squamous cell lesion Ref to derm for eval and tx     Relevant Orders      Ambulatory referral to Dermatology     Other   Routine general medical examination at a health care facility - Primary     Reviewed health habits including diet and exercise and skin cancer prevention Reviewed appropriate screening tests for age    Also reviewed health mt list, fam hx and immunization status , as well as social and family history   See HPI Lab rev     Obesity     Discussed how this problem influences overall health and the risks it imposes  Reviewed plan for weight loss with lower calorie diet (via better food choices and also portion control or program like weight watchers) and exercise building up to or more than 30 minutes 5 days per week including some aerobic activity       Hyperglycemia      Lab Results  Component Value Date   HGBA1C 6.1  11/17/2013   Improved Enc to continue working on low glycemic diet and wt loss     Mild hyperlipidemia     Up from last check  Disc goals for lipids and reasons to control them Rev labs with pt Rev low sat fat diet in detail

## 2013-11-24 NOTE — Patient Instructions (Signed)
Cholesterol is up a bit  (Avoid red meat/ fried foods/ egg yolks/ fatty breakfast meats/ butter, cheese and high fat dairy/ and shellfish ) Mostly - red meat  Stop at check out for referral to dermatology     Fat and Cholesterol Control Diet Fat and cholesterol levels in your blood and organs are influenced by your diet. High levels of fat and cholesterol may lead to diseases of the heart, small and large blood vessels, gallbladder, liver, and pancreas. CONTROLLING FAT AND CHOLESTEROL WITH DIET Although exercise and lifestyle factors are important, your diet is key. That is because certain foods are known to raise cholesterol and others to lower it. The goal is to balance foods for their effect on cholesterol and more importantly, to replace saturated and trans fat with other types of fat, such as monounsaturated fat, polyunsaturated fat, and omega-3 fatty acids. On average, a person should consume no more than 15 to 17 g of saturated fat daily. Saturated and trans fats are considered "bad" fats, and they will raise LDL cholesterol. Saturated fats are primarily found in animal products such as meats, butter, and cream. However, that does not mean you need to give up all your favorite foods. Today, there are good tasting, low-fat, low-cholesterol substitutes for most of the things you like to eat. Choose low-fat or nonfat alternatives. Choose round or loin cuts of red meat. These types of cuts are lowest in fat and cholesterol. Chicken (without the skin), fish, veal, and ground [place]key breast are great choices. Eliminate fatty meats, such as hot dogs and salami. Even shellfish have little or no saturated fat. Have a 3 oz (85 g) portion when you eat lean meat, poultry, or fish. Trans fats are also called "partially hydrogenated oils." They are oils that have been scientifically manipulated so that they are solid at room temperature resulting in a longer shelf life and improved taste and texture of foods in  which they are added. Trans fats are found in stick margarine, some tub margarines, cookies, crackers, and baked goods.  When baking and cooking, oils are a great substitute for butter. The monounsaturated oils are especially beneficial since it is believed they lower LDL and raise HDL. The oils you should avoid entirely are saturated tropical oils, such as coconut and palm.  Remember to eat a lot from food groups that are naturally free of saturated and trans fat, including fish, fruit, vegetables, beans, grains (barley, rice, couscous, bulgur wheat), and pasta (without cream sauces).  IDENTIFYING FOODS THAT LOWER FAT AND CHOLESTEROL  Soluble fiber may lower your cholesterol. This type of fiber is found in fruits such as apples, vegetables such as broccoli, potatoes, and carrots, legumes such as beans, peas, and lentils, and grains such as barley. Foods fortified with plant sterols (phytosterol) may also lower cholesterol. You should eat at least 2 g per day of these foods for a cholesterol lowering effect.  Read package labels to identify low-saturated fats, trans fat free, and low-fat foods at the supermarket. Select cheeses that have only 2 to 3 g saturated fat per ounce. Use a heart-healthy tub margarine that is free of trans fats or partially hydrogenated oil. When buying baked goods (cookies, crackers), avoid partially hydrogenated oils. Breads and muffins should be made from whole grains (whole-wheat or whole oat flour, instead of "flour" or "enriched flour"). Buy non-creamy canned soups with reduced salt and no added fats.  FOOD PREPARATION TECHNIQUES  Never deep-fry. If you must fry, either stir-fry,  which uses very little fat, or use non-stick cooking sprays. When possible, broil, bake, or roast meats, and steam vegetables. Instead of putting butter or margarine on vegetables, use lemon and herbs, applesauce, and cinnamon (for squash and sweet potatoes). Use nonfat yogurt, salsa, and low-fat  dressings for salads.  LOW-SATURATED FAT / LOW-FAT FOOD SUBSTITUTES Meats / Saturated Fat (g)  Avoid: Steak, marbled (3 oz/85 g) / 11 g  Choose: Steak, lean (3 oz/85 g) / 4 g  Avoid: Hamburger (3 oz/85 g) / 7 g  Choose: Hamburger, lean (3 oz/85 g) / 5 g  Avoid: Ham (3 oz/85 g) / 6 g  Choose: Ham, lean cut (3 oz/85 g) / 2.4 g  Avoid: Chicken, with skin, dark meat (3 oz/85 g) / 4 g  Choose: Chicken, skin removed, dark meat (3 oz/85 g) / 2 g  Avoid: Chicken, with skin, light meat (3 oz/85 g) / 2.5 g  Choose: Chicken, skin removed, light meat (3 oz/85 g) / 1 g Dairy / Saturated Fat (g)  Avoid: Whole milk (1 cup) / 5 g  Choose: Low-fat milk, 2% (1 cup) / 3 g  Choose: Low-fat milk, 1% (1 cup) / 1.5 g  Choose: Skim milk (1 cup) / 0.3 g  Avoid: Hard cheese (1 oz/28 g) / 6 g  Choose: Skim milk cheese (1 oz/28 g) / 2 to 3 g  Avoid: Cottage cheese, 4% fat (1 cup) / 6.5 g  Choose: Low-fat cottage cheese, 1% fat (1 cup) / 1.5 g  Avoid: Ice cream (1 cup) / 9 g  Choose: Sherbet (1 cup) / 2.5 g  Choose: Nonfat frozen yogurt (1 cup) / 0.3 g  Choose: Frozen fruit bar / trace  Avoid: Whipped cream (1 tbs) / 3.5 g  Choose: Nondairy whipped topping (1 tbs) / 1 g Condiments / Saturated Fat (g)  Avoid: Mayonnaise (1 tbs) / 2 g  Choose: Low-fat mayonnaise (1 tbs) / 1 g  Avoid: Butter (1 tbs) / 7 g  Choose: Extra light margarine (1 tbs) / 1 g  Avoid: Coconut oil (1 tbs) / 11.8 g  Choose: Olive oil (1 tbs) / 1.8 g  Choose: Corn oil (1 tbs) / 1.7 g  Choose: Safflower oil (1 tbs) / 1.2 g  Choose: Sunflower oil (1 tbs) / 1.4 g  Choose: Soybean oil (1 tbs) / 2.4 g  Choose: Canola oil (1 tbs) / 1 g Document Released: 01/19/2005 Document Revised: 05/16/2012 Document Reviewed: 04/19/2013 ExitCare Patient Information 2015 [employer]. This information is not intended to replace advice given to you by your health care provider. Make sure you discuss any questions you have  with your health care provider.

## 2013-11-24 NOTE — Progress Notes (Signed)
Pre visit review using our clinic review tool, if applicable. No additional management support is needed unless otherwise documented below in the visit note. 

## 2013-11-24 NOTE — Assessment & Plan Note (Signed)
Reviewed health habits including diet and exercise and skin cancer prevention Reviewed appropriate screening tests for age  Also reviewed health mt list, fam hx and immunization status , as well as social and family history   See HPI Lab rev  

## 2013-12-14 ENCOUNTER — Ambulatory Visit: Payer: Self-pay | Admitting: Family Medicine

## 2013-12-15 ENCOUNTER — Encounter: Payer: Self-pay | Admitting: Family Medicine

## 2019-03-08 ENCOUNTER — Ambulatory Visit: Payer: BLUE CROSS/BLUE SHIELD | Attending: Internal Medicine

## 2019-03-08 DIAGNOSIS — Z20822 Contact with and (suspected) exposure to covid-19: Secondary | ICD-10-CM

## 2019-03-09 ENCOUNTER — Other Ambulatory Visit: Payer: Self-pay | Admitting: Internal Medicine

## 2019-03-09 DIAGNOSIS — U071 COVID-19: Secondary | ICD-10-CM

## 2019-03-09 LAB — NOVEL CORONAVIRUS, NAA: SARS-CoV-2, NAA: DETECTED — AB

## 2019-03-09 NOTE — Progress Notes (Unsigned)
  I connected by phone with Gabriella Lawson on 03/09/2019 at 5:58 PM to discuss the potential use of an new treatment for mild to moderate COVID-19 viral infection in non-hospitalized patients.  This patient is a 64 y.o. female that meets the FDA criteria for Emergency Use Authorization of bamlanivimab or casirivimab\imdevimab.  Has a (+) direct SARS-CoV-2 viral test result  Has mild or moderate COVID-19   Is ? 63 years of age and weighs ? 40 kg  Is NOT hospitalized due to COVID-19  Is NOT requiring oxygen therapy or requiring an increase in baseline oxygen flow rate due to COVID-19  Is within 10 days of symptom onset  Has at least one of the high risk factor(s) for progression to severe COVID-19 and/or hospitalization as defined in EUA.  Specific high risk criteria : BMI >/= 35   I have spoken and communicated the following to the patient or parent/caregiver:  1. FDA has authorized the emergency use of bamlanivimab and casirivimab\imdevimab for the treatment of mild to moderate COVID-19 in adults and pediatric patients with positive results of direct SARS-CoV-2 viral testing who are 70 years of age and older weighing at least 40 kg, and who are at high risk for progressing to severe COVID-19 and/or hospitalization.  2. The significant known and potential risks and benefits of bamlanivimab and casirivimab\imdevimab, and the extent to which such potential risks and benefits are unknown.  3. Information on available alternative treatments and the risks and benefits of those alternatives, including clinical trials.  4. Patients treated with bamlanivimab and casirivimab\imdevimab should continue to self-isolate and use infection control measures (e.g., wear mask, isolate, social distance, avoid sharing personal items, clean and disinfect "high touch" surfaces, and frequent handwashing) according to CDC guidelines.   5. The patient or parent/caregiver has the option to accept or refuse  bamlanivimab or casirivimab\imdevimab .  After reviewing this information with the patient, The patient agreed to proceed with receiving the bamlanimivab infusion and will be provided a copy of the Fact sheet prior to receiving the infusion..  Pt scheduled for infusion 2/5 at 10:30am.   Cyndee Brightly, NP-C [facility]  pgr [phone]

## 2019-03-10 ENCOUNTER — Encounter (HOSPITAL_COMMUNITY): Payer: Self-pay

## 2019-03-10 ENCOUNTER — Ambulatory Visit (HOSPITAL_COMMUNITY)
Admission: RE | Admit: 2019-03-10 | Discharge: 2019-03-10 | Disposition: A | Discharge status: Home / Self Care | Payer: BLUE CROSS/BLUE SHIELD | Source: Ambulatory Visit | Attending: Pulmonary Disease | Admitting: Pulmonary Disease

## 2019-03-10 DIAGNOSIS — U071 COVID-19: Secondary | ICD-10-CM

## 2019-03-10 MED ORDER — METHYLPREDNISOLONE SODIUM SUCC 125 MG IJ SOLR: 125.0000 mg | Freq: Once | INTRAMUSCULAR | Status: DC | PRN

## 2019-03-10 MED ORDER — EPINEPHRINE 0.3 MG/0.3ML IJ SOAJ: 0.3000 mg | Freq: Once | INTRAMUSCULAR | Status: DC | PRN

## 2019-03-10 MED ORDER — DIPHENHYDRAMINE HCL 50 MG/ML IJ SOLN: 50.0000 mg | Freq: Once | INTRAMUSCULAR | Status: DC | PRN

## 2019-03-10 MED ORDER — FAMOTIDINE IN NACL 20-0.9 MG/50ML-% IV SOLN: 20.0000 mg | Freq: Once | INTRAVENOUS | Status: DC | PRN

## 2019-03-10 MED ORDER — SODIUM CHLORIDE 0.9 % IV SOLN
700.0000 mg | Freq: Once | INTRAVENOUS | Status: AC
  Administered 2019-03-10: 700 mg via INTRAVENOUS
  Filled 2019-03-10: qty 20

## 2019-03-10 MED ORDER — SODIUM CHLORIDE 0.9 % IV SOLN
INTRAVENOUS | Status: DC | PRN
  Administered 2019-03-10: 250 mL via INTRAVENOUS

## 2019-03-10 MED ORDER — ALBUTEROL SULFATE HFA 108 (90 BASE) MCG/ACT IN AERS: 2.0000 | INHALATION_SPRAY | Freq: Once | RESPIRATORY_TRACT | Status: DC | PRN

## 2019-03-10 NOTE — Discharge Instructions (Signed)

## 2019-03-10 NOTE — Progress Notes (Signed)
  Diagnosis: COVID-19  Physician: Dr Wright  Procedure: Covid Infusion Clinic Med: bamlanivimab infusion - Provided patient with bamlanimivab fact sheet for patients, parents and caregivers prior to infusion.  Complications: No immediate complications noted.  Discharge: Discharged home   Briscoe Daniello 03/10/2019  

## 2019-06-09 ENCOUNTER — Ambulatory Visit: Payer: BLUE CROSS/BLUE SHIELD | Attending: Oncology

## 2019-06-09 DIAGNOSIS — Z23 Encounter for immunization: Secondary | ICD-10-CM

## 2019-06-09 NOTE — Progress Notes (Signed)
   Covid-19 Vaccination Clinic  Name:  Gabriella Lawson    MRN: [id number] DOB: [birth year]  06/09/2019  [name] was observed post Covid-19 immunization for 15 minutes without incident. She was provided with Vaccine Information Sheet and instruction to access the V-Safe system.   [name] was instructed to call 911 with any severe reactions post vaccine: Marland Kitchen Difficulty breathing  . Swelling of face and throat  . A fast heartbeat  . A bad rash all over body  . Dizziness and weakness   Immunizations Administered    Name Date Dose VIS Date Route   Pfizer COVID-19 Vaccine 06/09/2019  8:08 AM 0.3 mL 03/29/2018 Intramuscular   Manufacturer: [employer], [employer]   Lot: [id number]   NDC: [address]-9

## 2019-07-04 ENCOUNTER — Ambulatory Visit: Payer: BLUE CROSS/BLUE SHIELD | Attending: Internal Medicine

## 2019-07-04 DIAGNOSIS — Z23 Encounter for immunization: Secondary | ICD-10-CM

## 2019-07-04 NOTE — Progress Notes (Signed)
   Covid-19 Vaccination Clinic  Name:  Gabriella Lawson    MRN: [id number] DOB: 06/13/1955  07/04/2019  [name] was observed post Covid-19 immunization for 15 minutes without incident. She was provided with Vaccine Information Sheet and instruction to access the V-Safe system.   [name] was instructed to call 911 with any severe reactions post vaccine: Marland Kitchen Difficulty breathing  . Swelling of face and throat  . A fast heartbeat  . A bad rash all over body  . Dizziness and weakness   Immunizations Administered    Name Date Dose VIS Date Route   Pfizer COVID-19 Vaccine 07/04/2019  8:11 AM 0.3 mL 03/29/2018 Intramuscular   Manufacturer: [employer], [employer]   Lot: [id number]   NDC: [address]-0

## 2022-03-25 ENCOUNTER — Encounter: Payer: Self-pay | Admitting: Physician Assistant

## 2022-03-25 ENCOUNTER — Other Ambulatory Visit: Payer: Self-pay

## 2022-03-25 ENCOUNTER — Telehealth: Payer: Self-pay

## 2022-03-25 ENCOUNTER — Ambulatory Visit (INDEPENDENT_AMBULATORY_CARE_PROVIDER_SITE_OTHER): Payer: Medicare Other | Admitting: Physician Assistant

## 2022-03-25 VITALS — BP 157/75 | HR 84 | Temp 98.1°F | Ht 61.0 in | Wt 247.0 lb

## 2022-03-25 DIAGNOSIS — K644 Residual hemorrhoidal skin tags: Secondary | ICD-10-CM | POA: Diagnosis not present

## 2022-03-25 DIAGNOSIS — Z1211 Encounter for screening for malignant neoplasm of colon: Secondary | ICD-10-CM

## 2022-03-25 DIAGNOSIS — Z1231 Encounter for screening mammogram for malignant neoplasm of breast: Secondary | ICD-10-CM

## 2022-03-25 DIAGNOSIS — Z13820 Encounter for screening for osteoporosis: Secondary | ICD-10-CM | POA: Diagnosis not present

## 2022-03-25 DIAGNOSIS — K625 Hemorrhage of anus and rectum: Secondary | ICD-10-CM

## 2022-03-25 DIAGNOSIS — Z780 Asymptomatic menopausal state: Secondary | ICD-10-CM

## 2022-03-25 DIAGNOSIS — Z23 Encounter for immunization: Secondary | ICD-10-CM | POA: Diagnosis not present

## 2022-03-25 DIAGNOSIS — R030 Elevated blood-pressure reading, without diagnosis of hypertension: Secondary | ICD-10-CM | POA: Insufficient documentation

## 2022-03-25 MED ORDER — HYDROCORTISONE ACETATE 25 MG RE SUPP: 25.0000 mg | Freq: Two times a day (BID) | RECTAL | 0 refills | Status: DC

## 2022-03-25 MED ORDER — NA SULFATE-K SULFATE-MG SULF 17.5-3.13-1.6 GM/177ML PO SOLN: 1.0000 | Freq: Once | ORAL | 0 refills | Status: AC

## 2022-03-25 NOTE — Progress Notes (Signed)
New patient visit   Patient: Gabriella Lawson   DOB: 1955/09/22   67 y.o. Female  MRN: [id number] Visit Date: 03/25/2022  Today's healthcare provider: Mikey Kirschner, PA-C   Cc. New patient, establish care, rectal bleeding  Subjective    Gabriella Lawson is a 67 y.o. female who presents today as a new patient to establish care.  HPI  Pt reports history of hemorrhoids, she will feel something near her rectum. Reports rectal bleeding intermittently with BM x 3-4 months. Sees on toilet paper, in toilet, and sometimes on underwear. Denies any rectal pain, itching. Denies constipation.   Pt denies history of hypertension. She is s/p TAH in 1996.   Past Medical History:  Diagnosis Date   H/O osteopenia    mild   History of IBS    managed with diet (chronic constipation)   History of uterine fibroid    had systerectomy   Past Surgical History:  Procedure Laterality Date   ANKLE FRACTURE SURGERY     steel plate/7 screws (fx in 3 places adn dislocated)   [place]   for fibroids   TONSILLECTOMY  1964   Family Status  Relation Name Status   Mother  (Not Specified)   Father  (Not Specified)   MGF  (Not Specified)   PGF  (Not Specified)   Other  (Not Specified)   Family History  Problem Relation Age of Onset   Stroke Mother    Osteoporosis Mother    Cancer Father    Heart attack Maternal Grandfather    Heart attack Paternal Grandfather    Osteoporosis Other        grandmother   Social History   Socioeconomic History   Marital status: Single    Spouse name: Not on file   Number of children: Not on file   Years of education: Not on file   Highest education level: Not on file  Occupational History   Not on file  Tobacco Use   Smoking status: Never   Smokeless tobacco: Not on file  Substance and Sexual Activity   Alcohol use: Yes    Comment: social   Drug use: No   Sexual activity: Not on file  Other Topics Concern    Not on file  Social History Narrative   Not on file   Social Determinants of Health   Financial Resource Strain: Not on file  Food Insecurity: Not on file  Transportation Needs: Not on file  Physical Activity: Not on file  Stress: Not on file  Social Connections: Not on file   Outpatient Medications Prior to Visit  Medication Sig   aspirin 81 MG tablet Take 81 mg by mouth daily.   No facility-administered medications prior to visit.   Allergies  Allergen Reactions   Etodolac Rash    Red skin that burned    Immunization History  Administered Date(s) Administered   Influenza,inj,Quad PF,6+ Mos 11/04/2014   Influenza-Unspecified 11/10/2012, 11/09/2013   PFIZER(Purple Top)SARS-COV-2 Vaccination 06/09/2019, 07/04/2019   PNEUMOCOCCAL CONJUGATE-20 03/25/2022   Td 02/03/2004    Health Maintenance  Topic Date Due   Medicare Annual Wellness (AWV)  Never done   HIV Screening  Never done   Hepatitis C Screening  Never done   Zoster Vaccines- Shingrix (1 of 2) Never done   DTaP/Tdap/Td (2 - Tdap) 02/02/2014   MAMMOGRAM  12/15/2014   COLONOSCOPY (Pts 45-89yr Insurance coverage will need  to be confirmed)  02/04/2015   INFLUENZA VACCINE  09/02/2021   COVID-19 Vaccine (3 - 2023-24 season) 10/03/2021   DEXA SCAN  Never done   Pneumonia Vaccine 28+ Years old  Completed   HPV VACCINES  Aged Out   PAP SMEAR-Modifier  Discontinued    Patient Care Team: Mikey Kirschner, PA-C as PCP - General (Physician Assistant)  Review of Systems  Constitutional:  Negative for fatigue and fever.  Respiratory:  Negative for cough and shortness of breath.   Cardiovascular:  Negative for chest pain and leg swelling.  Gastrointestinal:  Positive for blood in stool. Negative for abdominal pain.  Neurological:  Negative for dizziness and headaches.      Objective    BP (!) 157/75 (BP Location: Left Arm, Patient Position: Sitting, Cuff Size: Large)   Pulse 84   Temp 98.1 F (36.7 C)   Ht 5'  1" (1.549 m)   Wt 247 lb (112 kg)   SpO2 100%   BMI 46.67 kg/m    Physical Exam Constitutional:      General: She is awake.     Appearance: She is well-developed.  HENT:     Head: Normocephalic.  Eyes:     Conjunctiva/sclera: Conjunctivae normal.  Cardiovascular:     Rate and Rhythm: Normal rate and regular rhythm.     Heart sounds: Normal heart sounds.  Pulmonary:     Effort: Pulmonary effort is normal.     Breath sounds: Normal breath sounds.  Genitourinary:    Rectum: External hemorrhoid present.     Comments: Non bleeding external hemorrhoids  Musculoskeletal:     Right lower leg: No edema.     Left lower leg: No edema.  Skin:    General: Skin is warm.  Neurological:     Mental Status: She is alert and oriented to person, place, and time.  Psychiatric:        Attention and Perception: Attention normal.        Mood and Affect: Mood normal.        Speech: Speech normal.        Behavior: Behavior is cooperative.     Depression Screen    03/25/2022    1:39 PM  PHQ 2/9 Scores  PHQ - 2 Score 0  PHQ- 9 Score 0   No results found for any visits on 03/25/22.  Assessment & Plan      Problem List Items Addressed This Visit       Cardiovascular and Mediastinum   External hemorrhoids    Rx hydrocortisone suppository Ref to gi      Relevant Medications   hydrocortisone (ANUSOL-HC) 25 MG suppository   Other Relevant Orders   CBC w/Diff/Platelet   Comprehensive Metabolic Panel (CMET)     Digestive   Rectal bleeding - Primary    Will check cbc, iron given prolonged nature of bleeding. Tx as hemorrhoids with hydrocortisone suppository Pt is overdue for colonoscopy, ref to GI      Relevant Orders   CBC w/Diff/Platelet   Comprehensive Metabolic Panel (CMET)   Iron, TIBC and Ferritin Panel     Other   Elevated blood pressure reading    Elevated in office Encouraged pt to check at home and f/b in 4-6 weeks to discuss      Other Visit Diagnoses      Encounter for osteoporosis screening in asymptomatic postmenopausal patient       Relevant Orders   DG Bone Density  Breast cancer screening by mammogram       Relevant Orders   MM 3D SCREEN BREAST BILATERAL   Colon cancer screening       Relevant Orders   Ambulatory referral to Gastroenterology   Need for vaccination against Streptococcus pneumoniae       Relevant Orders   Pneumococcal conjugate vaccine 20-valent (Prevnar 20) (Completed)       Return in about 4 weeks (around 04/22/2022) for hypertension.     I, Mikey Kirschner, PA-C have reviewed all documentation for this visit. The documentation on  03/25/22 for the exam, diagnosis, procedures, and orders are all accurate and complete.  Mikey Kirschner, PA-C [facility] [address] [place], [place], [address] Office: [phone] Fax: [place]

## 2022-03-25 NOTE — Assessment & Plan Note (Signed)
Rx hydrocortisone suppository Ref to gi

## 2022-03-25 NOTE — Assessment & Plan Note (Signed)
Will check cbc, iron given prolonged nature of bleeding. Tx as hemorrhoids with hydrocortisone suppository Pt is overdue for colonoscopy, ref to GI

## 2022-03-25 NOTE — Telephone Encounter (Signed)
Gastroenterology Pre-Procedure Review  Request Date: 05/29/22 Requesting Physician: Dr. Marius Ditch  PATIENT REVIEW QUESTIONS: The patient responded to the following health history questions as indicated:    1. Are you having any GI issues? no 2. Do you have a personal history of Polyps? no 3. Do you have a family history of Colon Cancer or Polyps? no 4. Diabetes Mellitus? no 5. Joint replacements in the past 12 months?no 6. Major health problems in the past 3 months?no 7. Any artificial heart valves, MVP, or defibrillator?no    MEDICATIONS & ALLERGIES:    Patient reports the following regarding taking any anticoagulation/antiplatelet therapy:   Plavix, Coumadin, Eliquis, Xarelto, Lovenox, Pradaxa, Brilinta, or Effient? no Aspirin? no  Patient confirms/reports the following medications:  Current Outpatient Medications  Medication Sig Dispense Refill   aspirin 81 MG tablet Take 81 mg by mouth daily.     hydrocortisone (ANUSOL-HC) 25 MG suppository Place 1 suppository (25 mg total) rectally 2 (two) times daily. Use for 6 days 12 suppository 0   No current facility-administered medications for this visit.    Patient confirms/reports the following allergies:  Allergies  Allergen Reactions   Etodolac Rash    Red skin that burned    No orders of the defined types were placed in this encounter.   AUTHORIZATION INFORMATION Primary Insurance: 1D#: Group #:  Secondary Insurance: 1D#: Group #:  SCHEDULE INFORMATION: Date: 05/29/22 Time: Location: [facility]

## 2022-03-25 NOTE — Assessment & Plan Note (Signed)
Elevated in office Encouraged pt to check at home and f/b in 4-6 weeks to discuss

## 2022-03-26 LAB — COMPREHENSIVE METABOLIC PANEL
ALT: 19 IU/L (ref 0–32)
AST: 18 IU/L (ref 0–40)
Albumin/Globulin Ratio: 1.9 (ref 1.2–2.2)
Albumin: 4.5 g/dL (ref 3.9–4.9)
Alkaline Phosphatase: 118 IU/L (ref 44–121)
BUN/Creatinine Ratio: 21 (ref 12–28)
BUN: 17 mg/dL (ref 8–27)
Bilirubin Total: <0.2 mg/dL (ref 0.0–1.2)
CO2: 24 mmol/L (ref 20–29)
Calcium: 9.5 mg/dL (ref 8.7–10.3)
Chloride: 102 mmol/L (ref 96–106)
Creatinine, Ser: 0.82 mg/dL (ref 0.57–1.00)
Globulin, Total: 2.4 g/dL (ref 1.5–4.5)
Glucose: 116 mg/dL — ABNORMAL HIGH (ref 70–99)
Potassium: 5.4 mmol/L — ABNORMAL HIGH (ref 3.5–5.2)
Sodium: 141 mmol/L (ref 134–144)
Total Protein: 6.9 g/dL (ref 6.0–8.5)
eGFR: 79 mL/min/{1.73_m2} (ref >59)

## 2022-03-26 LAB — IRON,TIBC AND FERRITIN PANEL
Ferritin: 76 ng/mL (ref 15–150)
Iron Saturation: 14 % — ABNORMAL LOW (ref 15–55)
Iron: 57 ug/dL (ref 27–139)
Total Iron Binding Capacity: 410 ug/dL (ref 250–450)
UIBC: 353 ug/dL (ref 118–369)

## 2022-03-26 LAB — CBC WITH DIFFERENTIAL/PLATELET
Basophils Absolute: 0.1 10*3/uL (ref 0.0–0.2)
Basos: 1 %
EOS (ABSOLUTE): 0.1 10*3/uL (ref 0.0–0.4)
Eos: 2 %
Hematocrit: 44.2 % (ref 34.0–46.6)
Hemoglobin: 14.1 g/dL (ref 11.1–15.9)
Immature Grans (Abs): 0 10*3/uL (ref 0.0–0.1)
Immature Granulocytes: 0 %
Lymphocytes Absolute: 1.6 10*3/uL (ref 0.7–3.1)
Lymphs: 20 %
MCH: 27.6 pg (ref 26.6–33.0)
MCHC: 31.9 g/dL (ref 31.5–35.7)
MCV: 87 fL (ref 79–97)
Monocytes Absolute: 0.7 10*3/uL (ref 0.1–0.9)
Monocytes: 8 %
Neutrophils Absolute: 5.5 10*3/uL (ref 1.4–7.0)
Neutrophils: 69 %
Platelets: 319 10*3/uL (ref 150–450)
RBC: 5.1 x10E6/uL (ref 3.77–5.28)
RDW: 12.5 % (ref 11.7–15.4)
WBC: 8 10*3/uL (ref 3.4–10.8)

## 2022-04-08 ENCOUNTER — Telehealth: Payer: Self-pay | Admitting: Physician Assistant

## 2022-04-08 ENCOUNTER — Other Ambulatory Visit: Payer: Self-pay | Admitting: Physician Assistant

## 2022-04-08 MED ORDER — PHENYLEPHRINE IN HARD FAT 0.25 % RE SUPP: 1.0000 | Freq: Two times a day (BID) | RECTAL | 0 refills | Status: DC

## 2022-04-08 NOTE — Telephone Encounter (Signed)
Patient is calling in because she was prescribed medication for hemorrhoids and the medication is not working. Patient would like to speak with the nurse or [name] regarding what to do next. Please follow up with patient.

## 2022-04-09 NOTE — Telephone Encounter (Signed)
Patient advised. Verbalized understanding but expressed she thinks she should just call GI to see because she would like to have this handled first. Juluis Rainier

## 2022-04-09 NOTE — Telephone Encounter (Signed)
Ok ty

## 2022-05-11 ENCOUNTER — Encounter: Payer: Self-pay | Admitting: Dermatology

## 2022-05-11 ENCOUNTER — Ambulatory Visit (INDEPENDENT_AMBULATORY_CARE_PROVIDER_SITE_OTHER): Payer: Medicare Other | Admitting: Dermatology

## 2022-05-11 DIAGNOSIS — L820 Inflamed seborrheic keratosis: Secondary | ICD-10-CM | POA: Diagnosis not present

## 2022-05-11 DIAGNOSIS — H0260 Xanthelasma of unspecified eye, unspecified eyelid: Secondary | ICD-10-CM

## 2022-05-11 DIAGNOSIS — L578 Other skin changes due to chronic exposure to nonionizing radiation: Secondary | ICD-10-CM | POA: Diagnosis not present

## 2022-05-11 DIAGNOSIS — H0261 Xanthelasma of right upper eyelid: Secondary | ICD-10-CM

## 2022-05-11 DIAGNOSIS — H0264 Xanthelasma of left upper eyelid: Secondary | ICD-10-CM | POA: Diagnosis not present

## 2022-05-11 DIAGNOSIS — L821 Other seborrheic keratosis: Secondary | ICD-10-CM | POA: Diagnosis not present

## 2022-05-11 NOTE — Progress Notes (Signed)
   New Patient Visit   Subjective  Gabriella Lawson is a 67 y.o. female who presents for the following: spots at eyelids, spot at right eyelid seems to be getting larger and patient would like removed if possible. Also a raised spot at left cheek, present for about 1 year.  The patient has spots, moles and lesions to be evaluated, some may be new or changing and the patient has concerns that these could be cancer.  FAMILY HISTORY OF SKIN CANCER What type(s):melanoma in situ Who affected:father  The following portions of the chart were reviewed this encounter and updated as appropriate: medications, allergies, medical history  Review of Systems:  No other skin or systemic complaints except as noted in HPI or Assessment and Plan.  Objective  Well appearing patient in no apparent distress; mood and affect are within normal limits. A focused examination was performed of the following areas: face Relevant exam findings are noted in the Assessment and Plan.  upper medial eyelids R upper medial eyelid 0.8 cm L upper medial eyelid 0.4 cm         Assessment & Plan   INFLAMED SEBORRHEIC KERATOSIS Exam: Erythematous keratotic or waxy stuck-on papule or plaque.  Symptomatic, irritating, patient would like treated.  Benign-appearing.  Call clinic for new or changing lesions.   Prior to procedure, discussed risks of blister formation, small wound, skin dyspigmentation, or rare scar following treatment. Recommend Vaseline ointment to treated areas while healing.  Destruction Procedure Note Destruction method: cryotherapy   Informed consent: discussed and consent obtained   Lesion destroyed using liquid nitrogen: Yes   Outcome: patient tolerated procedure well with no complications   Post-procedure details: wound care instructions given   Locations: left cheek # of Lesions Treated: 1  Xanthelasma upper medial eyelids Benign-appearing.  Observation.  Call clinic for new or  changing lesions.   Recommend patient have lipids/cholesterol checked. Advised removal would be cosmetic. Consider blepharoplasty with ophthalmologist or plastic surgeon.   SEBORRHEIC KERATOSIS - Stuck-on, waxy, tan-brown papules and/or plaques  - Benign-appearing - Discussed benign etiology and prognosis. - Observe - Call for any changes  ACTINIC DAMAGE - chronic, secondary to cumulative UV radiation exposure/sun exposure over time - diffuse scaly erythematous macules with underlying dyspigmentation - Recommend daily broad spectrum sunscreen SPF 30+ to sun-exposed areas, reapply every 2 hours as needed.  - Recommend staying in the shade or wearing long sleeves, sun glasses (UVA+UVB protection) and wide brim hats (4-inch brim around the entire circumference of the hat). - Call for new or changing lesions.  No follow-ups on file.  Anise Salvo, RMA, am acting as scribe for Armida Sans, MD .  Documentation: I have reviewed the above documentation for accuracy and completeness, and I agree with the above.  Armida Sans, MD

## 2022-05-13 ENCOUNTER — Ambulatory Visit
Admission: RE | Admit: 2022-05-13 | Discharge: 2022-05-13 | Disposition: A | Discharge status: Home / Self Care | Payer: Medicare Other | Source: Ambulatory Visit | Attending: Physician Assistant | Admitting: Physician Assistant

## 2022-05-13 ENCOUNTER — Ambulatory Visit: Payer: BLUE CROSS/BLUE SHIELD | Admitting: Family Medicine

## 2022-05-13 DIAGNOSIS — Z1231 Encounter for screening mammogram for malignant neoplasm of breast: Secondary | ICD-10-CM | POA: Diagnosis present

## 2022-05-13 DIAGNOSIS — Z13820 Encounter for screening for osteoporosis: Secondary | ICD-10-CM

## 2022-05-13 DIAGNOSIS — Z780 Asymptomatic menopausal state: Secondary | ICD-10-CM | POA: Diagnosis present

## 2022-05-20 ENCOUNTER — Encounter: Payer: Self-pay | Admitting: Dermatology

## 2022-05-21 ENCOUNTER — Ambulatory Visit: Payer: BLUE CROSS/BLUE SHIELD | Admitting: Dermatology

## 2022-05-29 ENCOUNTER — Ambulatory Visit
Admission: RE | Admit: 2022-05-29 | Discharge: 2022-05-29 | Disposition: A | Discharge status: Home / Self Care | Payer: Medicare Other | Attending: Gastroenterology | Admitting: Gastroenterology

## 2022-05-29 ENCOUNTER — Telehealth: Payer: Self-pay

## 2022-05-29 ENCOUNTER — Encounter
Admission: RE | Disposition: A | Discharge status: Home / Self Care | Payer: Self-pay | Source: Home / Self Care | Attending: Gastroenterology

## 2022-05-29 ENCOUNTER — Ambulatory Visit: Payer: Medicare Other | Admitting: Certified Registered Nurse Anesthetist

## 2022-05-29 ENCOUNTER — Other Ambulatory Visit: Payer: Self-pay

## 2022-05-29 ENCOUNTER — Encounter: Payer: Self-pay | Admitting: Gastroenterology

## 2022-05-29 DIAGNOSIS — K639 Disease of intestine, unspecified: Secondary | ICD-10-CM

## 2022-05-29 DIAGNOSIS — Z1211 Encounter for screening for malignant neoplasm of colon: Secondary | ICD-10-CM | POA: Diagnosis not present

## 2022-05-29 DIAGNOSIS — D122 Benign neoplasm of ascending colon: Secondary | ICD-10-CM | POA: Diagnosis not present

## 2022-05-29 DIAGNOSIS — M8580 Other specified disorders of bone density and structure, unspecified site: Secondary | ICD-10-CM | POA: Insufficient documentation

## 2022-05-29 DIAGNOSIS — K635 Polyp of colon: Secondary | ICD-10-CM

## 2022-05-29 DIAGNOSIS — K6389 Other specified diseases of intestine: Secondary | ICD-10-CM | POA: Diagnosis not present

## 2022-05-29 DIAGNOSIS — K5289 Other specified noninfective gastroenteritis and colitis: Secondary | ICD-10-CM | POA: Diagnosis not present

## 2022-05-29 DIAGNOSIS — D123 Benign neoplasm of transverse colon: Secondary | ICD-10-CM | POA: Diagnosis not present

## 2022-05-29 DIAGNOSIS — K644 Residual hemorrhoidal skin tags: Secondary | ICD-10-CM | POA: Diagnosis not present

## 2022-05-29 DIAGNOSIS — Z6841 Body Mass Index (BMI) 40.0 and over, adult: Secondary | ICD-10-CM | POA: Insufficient documentation

## 2022-05-29 HISTORY — PX: COLONOSCOPY WITH PROPOFOL: SHX5780

## 2022-05-29 SURGERY — COLONOSCOPY WITH PROPOFOL
Anesthesia: General

## 2022-05-29 MED ORDER — SODIUM CHLORIDE 0.9 % IV SOLN: INTRAVENOUS | Status: DC

## 2022-05-29 MED ORDER — PROPOFOL 500 MG/50ML IV EMUL
INTRAVENOUS | Status: DC | PRN
  Administered 2022-05-29: 150 ug/kg/min via INTRAVENOUS

## 2022-05-29 MED ORDER — LIDOCAINE HCL (CARDIAC) PF 100 MG/5ML IV SOSY
PREFILLED_SYRINGE | INTRAVENOUS | Status: DC | PRN
  Administered 2022-05-29: 50 mg via INTRAVENOUS

## 2022-05-29 MED ORDER — PROPOFOL 10 MG/ML IV BOLUS
INTRAVENOUS | Status: DC | PRN
  Administered 2022-05-29: 80 mg via INTRAVENOUS

## 2022-05-29 MED ORDER — LIDOCAINE HCL (PF) 2 % IJ SOLN
INTRAMUSCULAR | Status: AC
  Filled 2022-05-29: qty 5

## 2022-05-29 MED ORDER — PROPOFOL 1000 MG/100ML IV EMUL
INTRAVENOUS | Status: AC
  Filled 2022-05-29: qty 100

## 2022-05-29 NOTE — Telephone Encounter (Signed)
Made Appointment for 06/04/2022

## 2022-05-29 NOTE — H&P (Signed)
Arlyss Repress, MD [address]  [address]  [place], [place] [address]  Main: [phone]  Fax: [phone] Pager: [phone]  Primary Care Physician:  Alfredia Ferguson, PA-C Primary Gastroenterologist:  Dr. Arlyss Repress  Pre-Procedure History & Physical: HPI:  Gabriella Lawson is a 67 y.o. female is here for an colonoscopy.   Past Medical History:  Diagnosis Date   H/O osteopenia    mild   History of IBS    managed with diet (chronic constipation)   History of uterine fibroid    had systerectomy    Past Surgical History:  Procedure Laterality Date   ABDOMINAL HYSTERECTOMY     ANKLE FRACTURE SURGERY     steel plate/7 screws (fx in 3 places adn dislocated)   BREAST BIOPSY  02/02/1994   PARTIAL HYSTERECTOMY  02/02/1994   for fibroids   TONSILLECTOMY  02/02/1962    Prior to Admission medications   Medication Sig Start Date End Date Taking? Authorizing Provider  aspirin 81 MG tablet Take 81 mg by mouth daily.   Yes [provider]  hydrocortisone (ANUSOL-HC) 25 MG suppository Place 1 suppository (25 mg total) rectally 2 (two) times daily. Use for 6 days 03/25/22   Alfredia Ferguson, PA-C  phenylephrine (,USE FOR PREPARATION-H,) 0.25 % suppository Place 1 suppository rectally 2 (two) times daily. 04/08/22   Alfredia Ferguson, PA-C    Allergies as of 03/25/2022 - Review Complete 03/25/2022  Allergen Reaction Noted   Etodolac Rash 11/04/2011    Family History  Problem Relation Age of Onset   Stroke Mother    Osteoporosis Mother    Cancer Father    Heart attack Maternal Grandfather    Heart attack Paternal Grandfather    Osteoporosis Other        grandmother    Social History   Socioeconomic History   Marital status: Single    Spouse name: Not on file   Number of children: Not on file   Years of education: Not on file   Highest education level: Not on file  Occupational History   Not on file  Tobacco Use   Smoking status: Never   Smokeless  tobacco: Not on file  Vaping Use   Vaping Use: Never used  Substance and Sexual Activity   Alcohol use: Yes    Comment: social   Drug use: No   Sexual activity: Not on file  Other Topics Concern   Not on file  Social History Narrative   Not on file   Social Determinants of Health   Financial Resource Strain: Not on file  Food Insecurity: Not on file  Transportation Needs: Not on file  Physical Activity: Not on file  Stress: Not on file  Social Connections: Not on file  Intimate Partner Violence: Not on file    Review of Systems: See HPI, otherwise negative ROS  Physical Exam: BP (!) 146/84   Pulse 87   Temp 97.9 F (36.6 C)   Resp 16   Ht 5\' 2"  (1.575 m)   Wt 109.3 kg   SpO2 100%   BMI 44.08 kg/m  General:   Alert,  pleasant and cooperative in NAD Head:  Normocephalic and atraumatic. Neck:  Supple; no masses or thyromegaly. Lungs:  Clear throughout to auscultation.    Heart:  Regular rate and rhythm. Abdomen:  Soft, nontender and nondistended. Normal bowel sounds, without guarding, and without rebound.   Neurologic:  Alert and  oriented x4;  grossly normal  neurologically.  Impression/Plan: Gabriella Lawson is here for an colonoscopy to be performed for colon cancer screening  Risks, benefits, limitations, and alternatives regarding  colonoscopy have been reviewed with the patient.  Questions have been answered.  All parties agreeable.   [name], MD  05/29/2022, 7:39 AM

## 2022-05-29 NOTE — Telephone Encounter (Signed)
-----   Message from Toney Reil, MD sent at 05/29/2022 11:36 AM EDT ----- Regarding: banding appt Please make a new pt appt for hemorrhoid banding when I'm on call  RV

## 2022-05-29 NOTE — Anesthesia Procedure Notes (Signed)
Date/Time: 05/29/2022 7:50 AM  Performed by: Ginger Carne, CRNAPre-anesthesia Checklist: Patient identified, Emergency Drugs available, Suction available, Patient being monitored and Timeout performed Patient Re-evaluated:Patient Re-evaluated prior to induction Oxygen Delivery Method: Nasal cannula Preoxygenation: Pre-oxygenation with 100% oxygen

## 2022-05-29 NOTE — Op Note (Signed)
[facility] Patient Name: Gabriella Lawson Procedure Date: 05/29/2022 7:01 AM MRN: [id number] Account #: [id number] Date of Birth: March 18, 1955 Admit Type: Outpatient Age: 67 Room: [facility] ENDO ROOM 2 Gender: Female Note Status: Finalized Instrument Name: Prentice Docker [id number] Procedure:             Colonoscopy Indications:           Screening for colorectal malignant neoplasm Providers:             Toney Reil MD, MD Referring MD:          No Local Md, MD (Referring MD) Medicines:             General Anesthesia Complications:         No immediate complications. Estimated blood loss: None. Procedure:             Pre-Anesthesia Assessment:                        - Prior to the procedure, a History and Physical was                         performed, and patient medications and allergies were                         reviewed. The patient is competent. The risks and                         benefits of the procedure and the sedation options and                         risks were discussed with the patient. All questions                         were answered and informed consent was obtained.                         Patient identification and proposed procedure were                         verified by the physician, the nurse, the                         anesthesiologist, the anesthetist and the technician                         in the pre-procedure area in the procedure room in the                         endoscopy suite. Mental Status Examination: alert and                         oriented. Airway Examination: normal oropharyngeal                         airway and neck mobility. Respiratory Examination:                         clear to auscultation. CV Examination: normal.  Prophylactic Antibiotics: The patient does not require                         prophylactic antibiotics. Prior Anticoagulants: The                          patient has taken no anticoagulant or antiplatelet                         agents. ASA Grade Assessment: III - A patient with                         severe systemic disease. After reviewing the risks and                         benefits, the patient was deemed in satisfactory                         condition to undergo the procedure. The anesthesia                         plan was to use general anesthesia. Immediately prior                         to administration of medications, the patient was                         re-assessed for adequacy to receive sedatives. The                         heart rate, respiratory rate, oxygen saturations,                         blood pressure, adequacy of pulmonary ventilation, and                         response to care were monitored throughout the                         procedure. The physical status of the patient was                         re-assessed after the procedure.                        After obtaining informed consent, the colonoscope was                         passed under direct vision. Throughout the procedure,                         the patient's blood pressure, pulse, and oxygen                         saturations were monitored continuously. The                         Colonoscope was introduced through the anus and  advanced to the the cecum, identified by appendiceal                         orifice and ileocecal valve. The colonoscopy was                         performed with moderate difficulty due to significant                         looping and the patient's body habitus. Successful                         completion of the procedure was aided by applying                         abdominal pressure. The patient tolerated the                         procedure well. The quality of the bowel preparation                         was adequate to identify polyps greater than 5 mm in                          size. The ileocecal valve, appendiceal orifice, and                         rectum were photographed. Findings:      Skin tags were found on perianal exam.      Two sessile polyps were found in the transverse colon and ascending       colon. The polyps were 6 to 8 mm in size. These polyps were removed with       a cold snare. Resection and retrieval were complete. Estimated blood       loss: none.      A localized area of mildly erythematous mucosa was found in the       ascending colon. Biopsies were taken with a cold forceps for histology.      Non-bleeding external hemorrhoids were found during retroflexion. The       hemorrhoids were large. Impression:            - Perianal skin tags found on perianal exam.                        - Two 6 to 8 mm polyps in the transverse colon and in                         the ascending colon, removed with a cold snare.                         Resected and retrieved.                        - Erythematous mucosa in the ascending colon. Biopsied.                        - Non-bleeding external hemorrhoids. Recommendation:        - Discharge  patient to home (with escort).                        - Resume previous diet today.                        - Continue present medications.                        - Await pathology results.                        - Repeat colonoscopy in 3 years with 2 day prep for                         surveillance. Procedure Code(s):     --- Professional ---                        [id number], Colonoscopy, flexible; with removal of                         tumor(s), polyp(s), or other lesion(s) by snare                         technique                        45380, 59, Colonoscopy, flexible; with biopsy, single                         or multiple Diagnosis Code(s):     --- Professional ---                        K64.4, Residual hemorrhoidal skin tags                        Z12.11, Encounter for screening for malignant neoplasm                          of colon                        D12.3, Benign neoplasm of transverse colon (hepatic                         flexure or splenic flexure)                        D12.2, Benign neoplasm of ascending colon                        K63.89, Other specified diseases of intestine CPT copyright 2022 American Medical Association. All rights reserved. The codes documented in this report are preliminary and upon coder review may  be revised to meet current compliance requirements. Dr. Libby Maw Toney Reil MD, MD 05/29/2022 8:20:19 AM This report has been signed electronically. Number of Addenda: 0 Note Initiated On: 05/29/2022 7:01 AM Scope Withdrawal Time: 0 hours 19 minutes 10 seconds  Total Procedure Duration: 0 hours 26 minutes 24 seconds  Estimated Blood Loss:  Estimated blood loss: none.      [facility]

## 2022-05-29 NOTE — Anesthesia Postprocedure Evaluation (Signed)
Anesthesia Post Note  Patient: Gabriella Lawson  Procedure(s) Performed: COLONOSCOPY WITH PROPOFOL  Patient location during evaluation: Endoscopy Anesthesia Type: General Level of consciousness: awake and alert Pain management: pain level controlled Vital Signs Assessment: post-procedure vital signs reviewed and stable Respiratory status: spontaneous breathing, nonlabored ventilation and respiratory function stable Cardiovascular status: blood pressure returned to baseline and stable Postop Assessment: no apparent nausea or vomiting Anesthetic complications: no   No notable events documented.   Last Vitals:  Vitals:   05/29/22 0832 05/29/22 0842  BP: 103/62 114/60  Pulse:    Resp:    Temp:    SpO2:      Last Pain:  Vitals:   05/29/22 0842  TempSrc:   PainSc: 0-No pain                 Foye Deer

## 2022-05-29 NOTE — Anesthesia Preprocedure Evaluation (Addendum)
Anesthesia Evaluation  Patient identified by MRN, date of birth, ID band Patient awake    Reviewed: Allergy & Precautions, H&P , NPO status , Patient's Chart, lab work & pertinent test results  Airway Mallampati: III  TM Distance: >3 FB Neck ROM: full    Dental no notable dental hx.    Pulmonary neg pulmonary ROS   Pulmonary exam normal        Cardiovascular negative cardio ROS Normal cardiovascular exam     Neuro/Psych negative neurological ROS  negative psych ROS   GI/Hepatic negative GI ROS, Neg liver ROS,,,  Endo/Other    Morbid obesity  Renal/GU negative Renal ROS  negative genitourinary   Musculoskeletal   Abdominal  (+) + obese  Peds  Hematology negative hematology ROS (+)   Anesthesia Other Findings Past Medical History: No date: H/O osteopenia     Comment:  mild No date: History of IBS     Comment:  managed with diet (chronic constipation) No date: History of uterine fibroid     Comment:  had systerectomy  Past Surgical History: No date: ANKLE FRACTURE SURGERY     Comment:  steel plate/7 screws (fx in 3 places adn dislocated) 1996: BREAST BIOPSY 1996: PARTIAL HYSTERECTOMY     Comment:  for fibroids 1964: TONSILLECTOMY     Reproductive/Obstetrics negative OB ROS                             Anesthesia Physical Anesthesia Plan  ASA: 3  Anesthesia Plan: General   Post-op Pain Management:    Induction: Intravenous  PONV Risk Score and Plan: Propofol infusion and TIVA  Airway Management Planned: Natural Airway  Additional Equipment:   Intra-op Plan:   Post-operative Plan:   Informed Consent: I have reviewed the patients History and Physical, chart, labs and discussed the procedure including the risks, benefits and alternatives for the proposed anesthesia with the patient or authorized representative who has indicated his/her understanding and acceptance.      Dental Advisory Given  Plan Discussed with: CRNA and Surgeon  Anesthesia Plan Comments:         Anesthesia Quick Evaluation

## 2022-05-29 NOTE — Transfer of Care (Signed)
Immediate Anesthesia Transfer of Care Note  Patient: Gabriella Lawson  Procedure(s) Performed: COLONOSCOPY WITH PROPOFOL  Patient Location: Endoscopy Unit  Anesthesia Type:General  Level of Consciousness: awake, alert , and oriented  Airway & Oxygen Therapy: Patient Spontanous Breathing and Patient connected to nasal cannula oxygen  Post-op Assessment: Report given to RN and Post -op Vital signs reviewed and stable  Post vital signs: Reviewed and stable  Last Vitals:  Vitals Value Taken Time  BP 119/66 05/29/22 0823  Temp 36.4 C 05/29/22 0822  Pulse 81 05/29/22 0824  Resp 18 05/29/22 0824  SpO2 97 % 05/29/22 0824  Vitals shown include unvalidated device data.  Last Pain:  Vitals:   05/29/22 0822  TempSrc: Temporal  PainSc: 0-No pain         Complications: No notable events documented.

## 2022-06-01 ENCOUNTER — Encounter: Payer: Self-pay | Admitting: Gastroenterology

## 2022-06-01 LAB — SURGICAL PATHOLOGY

## 2022-06-02 ENCOUNTER — Encounter: Payer: Self-pay | Admitting: Gastroenterology

## 2022-06-03 ENCOUNTER — Telehealth: Payer: Self-pay

## 2022-06-03 NOTE — Telephone Encounter (Signed)
-----   Message from Toney Reil, MD sent at 06/02/2022  4:17 PM EDT ----- Please inform patient that her pathology results reveal mild inflammation in her right colon.  Nothing to worry.  Please schedule nonurgent follow-up visit with me to discuss about pathology results  RV

## 2022-06-03 NOTE — Telephone Encounter (Signed)
Patient has appointment with you tomorrow 06/04/2022

## 2022-06-04 ENCOUNTER — Encounter: Payer: Self-pay | Admitting: Gastroenterology

## 2022-06-04 ENCOUNTER — Ambulatory Visit (INDEPENDENT_AMBULATORY_CARE_PROVIDER_SITE_OTHER): Payer: Medicare Other | Admitting: Gastroenterology

## 2022-06-04 ENCOUNTER — Other Ambulatory Visit: Payer: Self-pay

## 2022-06-04 VITALS — BP 137/83 | HR 79 | Temp 98.0°F | Ht 61.0 in | Wt 244.5 lb

## 2022-06-04 DIAGNOSIS — K529 Noninfective gastroenteritis and colitis, unspecified: Secondary | ICD-10-CM | POA: Diagnosis not present

## 2022-06-04 DIAGNOSIS — K641 Second degree hemorrhoids: Secondary | ICD-10-CM

## 2022-06-04 NOTE — Progress Notes (Signed)
PROCEDURE NOTE: The patient presents with symptomatic grade 1 hemorrhoids, unresponsive to maximal medical therapy, requesting rubber band ligation of his/her hemorrhoidal disease.  All risks, benefits and alternative forms of therapy were described and informed consent was obtained.  The decision was made to band the LL internal hemorrhoid, and the [facility] O'Regan System was used to perform band ligation without complication.  Digital anorectal examination was then performed to assure proper positioning of the band, and to adjust the banded tissue as required.  The patient was discharged home without pain or other issues.  Dietary and behavioral recommendations were given and (if necessary - prescriptions were given), along with follow-up instructions.  The patient will return 2 weeks for follow-up and possible additional banding as required.  No complications were encountered and the patient tolerated the procedure well.    

## 2022-06-04 NOTE — Progress Notes (Signed)
Arlyss Repress, MD [address]  [address]  [place], [place] [address]  Main: [phone]  Fax: [phone]    Gastroenterology Consultation  Referring Provider:     Alfredia Ferguson, PA-C Primary Care Physician:  Alfredia Ferguson, PA-C Primary Gastroenterologist:  Dr. Arlyss Repress Reason for Consultation: Symptomatic hemorrhoids        HPI:   Gabriella Lawson is a 67 y.o. female referred by Alfredia Ferguson, PA-C  for consultation & management of symptomatic hemorrhoids.  Patient reports that she has been longstanding history of hemorrhoidal symptoms.  Over the last 2 years, they have progressively become more frequent, almost every time she has a bowel movement.  Her symptoms include bright red blood per rectum on wiping, rectal pain, pressure, swelling, itching, leakage of mucus, soiling, prolapse.  She has tried sitz bath's, Preparation H and other hemorrhoidal remedies which did not provide much relief.  She denies any constipation or diarrhea, however spends about 10 to 20 minutes on the toilet.  She underwent screening colonoscopy and was found to have large hemorrhoids.  She is interested to undergo hemorrhoid banding  NSAIDs: None  Antiplts/Anticoagulants/Anti thrombotics: None  GI Procedures:  Screening colonoscopy 05/29/2022 DIAGNOSIS: A. COLON, ASCENDING; COLD BIOPSY: - BENIGN COLONIC MUCOSA WITH MILD NONSPECIFIC ACTIVE MUCOSAL COLITIS, AND FOCAL MILD ARCHITECTURAL DISTORTION AND LAMINA PROPRIA EXPANSION BY LYMPHOPLASMACYTIC INFILTRATE. - NEGATIVE FOR DYSPLASIA AND MALIGNANCY.  Comment: The histologic findings are relatively nonspecific, and may be seen in the setting of resolving self-limited infectious colitis, medication/drug reaction, or other inflammatory etiologies.  There is a degree of chronicity to this lesion, which would suggest against bowel preparation or iatrogenic etiologies.  Clinical and endoscopic correlation is recommended.  B. COLON  POLYP, ASCENDING; COLD SNARE: - SESSILE SERRATED POLYP. - NEGATIVE FOR DYSPLASIA AND MALIGNANCY.  C. COLON POLYP, TRANSVERSE; COLD SNARE: - SESSILE SERRATED POLYP. - NEGATIVE FOR DYSPLASIA AND MALIGNANCY.   Past Medical History:  Diagnosis Date   H/O osteopenia    mild   History of IBS    managed with diet (chronic constipation)   History of uterine fibroid    had systerectomy    Past Surgical History:  Procedure Laterality Date   ABDOMINAL HYSTERECTOMY     ANKLE FRACTURE SURGERY     steel plate/7 screws (fx in 3 places adn dislocated)   BREAST BIOPSY  02/02/1994   COLONOSCOPY WITH PROPOFOL N/A 05/29/2022   Procedure: COLONOSCOPY WITH PROPOFOL;  Surgeon: Toney Reil, MD;  Location: [facility] ENDOSCOPY;  Service: Gastroenterology;  Laterality: N/A;   PARTIAL HYSTERECTOMY  02/02/1994   for fibroids   TONSILLECTOMY  02/02/1962     Current Outpatient Medications:    aspirin 81 MG tablet, Take 81 mg by mouth daily. (Patient not taking: Reported on 06/04/2022), Disp: , Rfl:    Family History  Problem Relation Age of Onset   Stroke Mother    Osteoporosis Mother    Cancer Father    Heart attack Maternal Grandfather    Heart attack Paternal Grandfather    Osteoporosis Other        grandmother     Social History   Tobacco Use   Smoking status: Never  Vaping Use   Vaping Use: Never used  Substance Use Topics   Alcohol use: Yes    Comment: social   Drug use: No    Allergies as of 06/04/2022 - Review Complete 05/29/2022  Allergen Reaction Noted   Etodolac Rash 11/04/2011  Review of Systems:    All systems reviewed and negative except where noted in HPI.   Physical Exam:  BP 137/83 (BP Location: Right Arm, Patient Position: Sitting, Cuff Size: Large)   Pulse 79   Temp 98 F (36.7 C) (Oral)   Ht 5\' 1"  (1.549 m)   Wt 244 lb 8 oz (110.9 kg)   BMI 46.20 kg/m  No LMP recorded. Patient has had a hysterectomy.  General:   Alert,  Well-developed,  well-nourished, pleasant and cooperative in NAD Head:  Normocephalic and atraumatic. Eyes:  Sclera clear, no icterus.   Conjunctiva pink. Ears:  Normal auditory acuity. Nose:  No deformity, discharge, or lesions. Mouth:  No deformity or lesions,oropharynx pink & moist. Neck:  Supple; no masses or thyromegaly. Lungs:  Respirations even and unlabored.  Clear throughout to auscultation.   No wheezes, crackles, or rhonchi. No acute distress. Heart:  Regular rate and rhythm; no murmurs, clicks, rubs, or gallops. Abdomen:  Normal bowel sounds. Soft, non-tender and non-distended without masses, hepatosplenomegaly or hernias noted.  No guarding or rebound tenderness.   Rectal: Semihard stool in the rectal vault, perianal skin tag and mildly prolapsed left lateral external hemorrhoid, nontender digital rectal exam Msk:  Symmetrical without gross deformities. Good, equal movement & strength bilaterally. Pulses:  Normal pulses noted. Extremities:  No clubbing or edema.  No cyanosis. Neurologic:  Alert and oriented x3;  grossly normal neurologically. Skin:  Intact without significant lesions or rashes. No jaundice. Psych:  Alert and cooperative. Normal mood and affect.  Imaging Studies: No abdominal imaging  Assessment and Plan:   Gabriella Lawson is a 67 y.o. pleasant Caucasian female with morbid obesity, BMI 46 is seen in consultation for symptomatic external hemorrhoids despite medical therapy  I have discussed about outpatient hemorrhoid ligation, procedure, risks and benefits Consent obtained Proceed with hemorrhoid ligation today Start MiraLAX 17 g daily, mix it in large cup of water  Ascending colon erythema, incidentally detected on screening colonoscopy Nonspecific focal active colitis in the ascending colon with ?  Chronicity Patient does not have any GI symptoms Discussed about watchful waiting only unless she develops any lower GI symptoms Defer further workup at this time and  patient is agreeable   Follow up in 2 to 3 weeks   Arlyss Repress, MD

## 2022-06-09 ENCOUNTER — Encounter: Payer: Self-pay | Admitting: Gastroenterology

## 2022-06-22 ENCOUNTER — Encounter: Payer: Self-pay | Admitting: Gastroenterology

## 2022-06-22 ENCOUNTER — Ambulatory Visit (INDEPENDENT_AMBULATORY_CARE_PROVIDER_SITE_OTHER): Payer: Medicare Other | Admitting: Gastroenterology

## 2022-06-22 VITALS — BP 119/65 | HR 72 | Temp 98.0°F | Ht 61.0 in | Wt 243.4 lb

## 2022-06-22 DIAGNOSIS — K641 Second degree hemorrhoids: Secondary | ICD-10-CM | POA: Diagnosis not present

## 2022-06-22 NOTE — Progress Notes (Signed)

## 2022-07-08 ENCOUNTER — Encounter: Payer: Self-pay | Admitting: Gastroenterology

## 2022-07-08 ENCOUNTER — Ambulatory Visit (INDEPENDENT_AMBULATORY_CARE_PROVIDER_SITE_OTHER): Payer: Medicare Other | Admitting: Gastroenterology

## 2022-07-08 VITALS — BP 137/80 | HR 83 | Temp 97.8°F | Ht 61.0 in | Wt 245.5 lb

## 2022-07-08 DIAGNOSIS — K641 Second degree hemorrhoids: Secondary | ICD-10-CM

## 2022-07-08 NOTE — Progress Notes (Signed)
PROCEDURE NOTE: The patient presents with symptomatic grade 1 hemorrhoids, unresponsive to maximal medical therapy, requesting rubber band ligation of his/her hemorrhoidal disease.  All risks, benefits and alternative forms of therapy were described and informed consent was obtained.  The decision was made to band the RP internal hemorrhoid, and the [facility] O'Regan System was used to perform band ligation without complication.  Digital anorectal examination was then performed to assure proper positioning of the band, and to adjust the banded tissue as required.  The patient was discharged home without pain or other issues.  Dietary and behavioral recommendations were given and (if necessary - prescriptions were given), along with follow-up instructions.  The patient will return as needed  for follow-up and possible additional banding as required.  No complications were encountered and the patient tolerated the procedure well.   

## 2022-07-09 ENCOUNTER — Telehealth: Payer: Self-pay | Admitting: Physician Assistant

## 2022-07-09 NOTE — Telephone Encounter (Signed)
Contacted Deetta Perla to schedule their annual wellness visit. Welcome to Medicare visit Due by 10/04/2022.  Thank you,  [facility] Direct dial  [phone]

## 2022-07-29 ENCOUNTER — Telehealth: Payer: Self-pay

## 2022-07-29 NOTE — Telephone Encounter (Addendum)
Patient is calling because she states when she was last seen she states Dr. Allegra Lai states that she might need another banding on one of her hemorrhoids and to call back if she had problems. She states that this morning she started having some rectal bleeding. Made appointment for patient for 09/01/2022 in [place]

## 2022-09-01 ENCOUNTER — Ambulatory Visit: Payer: Medicare Other | Admitting: Gastroenterology

## 2022-09-01 ENCOUNTER — Encounter: Payer: Self-pay | Admitting: Gastroenterology

## 2022-09-01 VITALS — BP 137/83 | HR 79 | Temp 97.6°F | Ht 61.0 in | Wt 242.5 lb

## 2022-09-01 DIAGNOSIS — K643 Fourth degree hemorrhoids: Secondary | ICD-10-CM | POA: Diagnosis not present

## 2022-09-01 NOTE — Progress Notes (Signed)
Gabriella Repress, MD [address]  [address]  [place], [place] [address]  Main: [phone]  Fax: [phone]    Gastroenterology Consultation  Referring Provider:     Alfredia Ferguson, PA-C Primary Care Physician:  Alfredia Ferguson, PA-C Primary Gastroenterologist:  Dr. Arlyss Lawson Reason for Consultation: Symptomatic hemorrhoids        HPI:   Gabriella Lawson is a 67 y.o. female referred by Alfredia Ferguson, PA-C  for consultation & management of symptomatic hemorrhoids.  Patient reports that she has been longstanding history of hemorrhoidal symptoms.  Over the last 2 years, they have progressively become more frequent, almost every time she has a bowel movement.  Her symptoms include bright red blood per rectum on wiping, rectal pain, pressure, swelling, itching, leakage of mucus, soiling, prolapse.  She has tried sitz bath's, Preparation H and other hemorrhoidal remedies which did not provide much relief.  She denies any constipation or diarrhea, however spends about 10 to 20 minutes on the toilet.  She underwent screening colonoscopy and was found to have large hemorrhoids.  She is interested to undergo hemorrhoid banding  Follow-up visit 09/01/2022 The patient is here for follow-up of recurrence of some rectal bleeding that occurred on 07/29/2022.  This happened after she had difficulty moving her bowels.  She is no longer experiencing rectal bleeding.  She underwent ligation of right anterior, right posterior and left lateral hemorrhoids in the month of May and early June.  She does have grade 3-4 external hemorrhoid, predominantly left lateral.  She is interested to undergo repeat hemorrhoid banding today.  Patient does state that her hemorrhoidal symptoms have overall significantly improved  NSAIDs: None  Antiplts/Anticoagulants/Anti thrombotics: None  GI Procedures:  Screening colonoscopy 05/29/2022 DIAGNOSIS: A. COLON, ASCENDING; COLD BIOPSY: - BENIGN COLONIC MUCOSA WITH  MILD NONSPECIFIC ACTIVE MUCOSAL COLITIS, AND FOCAL MILD ARCHITECTURAL DISTORTION AND LAMINA PROPRIA EXPANSION BY LYMPHOPLASMACYTIC INFILTRATE. - NEGATIVE FOR DYSPLASIA AND MALIGNANCY.  Comment: The histologic findings are relatively nonspecific, and may be seen in the setting of resolving self-limited infectious colitis, medication/drug reaction, or other inflammatory etiologies.  There is a degree of chronicity to this lesion, which would suggest against bowel preparation or iatrogenic etiologies.  Clinical and endoscopic correlation is recommended.  B. COLON POLYP, ASCENDING; COLD SNARE: - SESSILE SERRATED POLYP. - NEGATIVE FOR DYSPLASIA AND MALIGNANCY.  C. COLON POLYP, TRANSVERSE; COLD SNARE: - SESSILE SERRATED POLYP. - NEGATIVE FOR DYSPLASIA AND MALIGNANCY.   Past Medical History:  Diagnosis Date   H/O osteopenia    mild   History of IBS    managed with diet (chronic constipation)   History of uterine fibroid    had systerectomy    Past Surgical History:  Procedure Laterality Date   ABDOMINAL HYSTERECTOMY     ANKLE FRACTURE SURGERY     steel plate/7 screws (fx in 3 places adn dislocated)   BREAST BIOPSY  02/02/1994   COLONOSCOPY WITH PROPOFOL N/A 05/29/2022   Procedure: COLONOSCOPY WITH PROPOFOL;  Surgeon: Toney Reil, MD;  Location: [facility] ENDOSCOPY;  Service: Gastroenterology;  Laterality: N/A;   PARTIAL HYSTERECTOMY  02/02/1994   for fibroids   TONSILLECTOMY  02/02/1962     Current Outpatient Medications:    aspirin 81 MG tablet, Take 81 mg by mouth daily. (Patient not taking: Reported on 09/01/2022), Disp: , Rfl:    Family History  Problem Relation Age of Onset   Stroke Mother    Osteoporosis Mother    Cancer  Father    Heart attack Maternal Grandfather    Heart attack Paternal Grandfather    Osteoporosis Other        grandmother     Social History   Tobacco Use   Smoking status: Never  Vaping Use   Vaping status: Never Used  Substance Use  Topics   Alcohol use: Yes    Comment: social   Drug use: No    Allergies as of 09/01/2022 - Review Complete 09/01/2022  Allergen Reaction Noted   Etodolac Rash 11/04/2011    Review of Systems:    All systems reviewed and negative except where noted in HPI.   Physical Exam:  BP 137/83 (BP Location: Right Arm, Patient Position: Sitting, Cuff Size: Large)   Pulse 79   Temp 97.6 F (36.4 C) (Oral)   Ht 5\' 1"  (1.549 m)   Wt 242 lb 8 oz (110 kg)   BMI 45.82 kg/m  No LMP recorded. Patient has had a hysterectomy.  General:   Alert,  Well-developed, well-nourished, pleasant and cooperative in NAD Head:  Normocephalic and atraumatic. Eyes:  Sclera clear, no icterus.   Conjunctiva pink. Ears:  Normal auditory acuity. Nose:  No deformity, discharge, or lesions. Mouth:  No deformity or lesions,oropharynx pink & moist. Neck:  Supple; no masses or thyromegaly. Lungs:  Respirations even and unlabored.  Clear throughout to auscultation.   No wheezes, crackles, or rhonchi. No acute distress. Heart:  Regular rate and rhythm; no murmurs, clicks, rubs, or gallops. Abdomen:  Normal bowel sounds. Soft, non-tender and non-distended without masses, hepatosplenomegaly or hernias noted.  No guarding or rebound tenderness.   Rectal:  perianal skin tag and prolapsed left lateral external hemorrhoid, nontender digital rectal exam Msk:  Symmetrical without gross deformities. Good, equal movement & strength bilaterally. Pulses:  Normal pulses noted. Extremities:  No clubbing or edema.  No cyanosis. Neurologic:  Alert and oriented x3;  grossly normal neurologically. Skin:  Intact without significant lesions or rashes. No jaundice. Psych:  Alert and cooperative. Normal mood and affect.  Imaging Studies: No abdominal imaging  Assessment and Plan:   Gabriella Lawson is a 67 y.o. pleasant Caucasian female with morbid obesity, BMI 46 is seen in consultation for symptomatic external hemorrhoids despite  medical therapy.  S/p ligation of RA, RP and LL hemorrhoids.  Recurrence of isolated episode of rectal bleeding after a hard bowel movement in June 2024  I have discussed about another outpatient hemorrhoid ligation, procedure, risks and benefits Consent obtained Proceed with hemorrhoid ligation today Continue MiraLAX 17 g daily, mix it in large cup of water  Ascending colon erythema, incidentally detected on screening colonoscopy Nonspecific focal active colitis in the ascending colon with ?  Chronicity Patient does not have any GI symptoms Discussed about watchful waiting only unless she develops any lower GI symptoms Defer further workup at this time and patient is agreeable   Follow up as needed   Gabriella Repress, MD

## 2022-09-01 NOTE — Progress Notes (Signed)
PROCEDURE NOTE: The patient presents with symptomatic grade 4 hemorrhoids, unresponsive to maximal medical therapy, requesting rubber band ligation of his/her hemorrhoidal disease.  All risks, benefits and alternative forms of therapy were described and informed consent was obtained.  The decision was made to band the LL internal hemorrhoid, and the [facility] O'Regan System was used to perform band ligation without complication.  Digital anorectal examination was then performed to assure proper positioning of the band, and to adjust the banded tissue as required.  The patient was discharged home without pain or other issues.  Dietary and behavioral recommendations were given and (if necessary - prescriptions were given), along with follow-up instructions.  The patient will return     as needed for follow-up and possible additional banding as required.  No complications were encountered and the patient tolerated the procedure well.

## 2022-10-06 ENCOUNTER — Ambulatory Visit (INDEPENDENT_AMBULATORY_CARE_PROVIDER_SITE_OTHER): Payer: Medicare Other

## 2022-10-06 VITALS — Ht 62.0 in | Wt 242.0 lb

## 2022-10-06 DIAGNOSIS — Z0000 Encounter for general adult medical examination without abnormal findings: Secondary | ICD-10-CM

## 2022-10-06 NOTE — Progress Notes (Addendum)
Subjective:   Gabriella Lawson is a 67 y.o. female who presents for an Initial Medicare Annual Wellness Visit.  Visit Complete: Virtual  I connected with  Gabriella Lawson on 10/06/22 by a audio enabled telemedicine application and verified that I am speaking with the correct person using two identifiers.  Patient Location: Home  Provider Location: Office/Clinic  I discussed the limitations of evaluation and management by telemedicine. The patient expressed understanding and agreed to proceed.  Vital Signs: Unable to obtain new vitals due to this being a telehealth visit.  Patient Medicare AWV questionnaire was completed by the patient on 09/30/22; I have confirmed that all information answered by patient is correct and no changes since this date.  Review of Systems    Cardiac Risk Factors include: advanced age (>55men, >5 women);obesity (BMI >30kg/m2);dyslipidemia    Objective:    Today's Vitals   10/06/22 1501  Weight: 242 lb (109.8 kg)  Height: 5\' 2"  (1.575 m)   Body mass index is 44.26 kg/m.     10/06/2022    3:11 PM 05/29/2022    7:29 AM  Advanced Directives  Does Patient Have a Medical Advance Directive? Yes Yes  Type of Estate agent of [place];Living will Healthcare Power of [place];Living will  Copy of Healthcare Power of Attorney in Chart?  No - copy requested    Current Medications (verified) Outpatient Encounter Medications as of 10/06/2022  Medication Sig   aspirin 81 MG tablet Take 81 mg by mouth daily. (Patient not taking: Reported on 09/01/2022)   No facility-administered encounter medications on file as of 10/06/2022.    Allergies (verified) Etodolac   History: Past Medical History:  Diagnosis Date   Allergy    Etodolac   H/O osteopenia    mild   History of IBS    managed with diet (chronic constipation)   History of uterine fibroid    had systerectomy   Past Surgical History:  Procedure Laterality Date   ABDOMINAL  HYSTERECTOMY     ANKLE FRACTURE SURGERY     steel plate/7 screws (fx in 3 places adn dislocated)   BREAST BIOPSY  02/02/1994   COLONOSCOPY WITH PROPOFOL N/A 05/29/2022   Procedure: COLONOSCOPY WITH PROPOFOL;  Surgeon: Toney Reil, MD;  Location: [facility] ENDOSCOPY;  Service: Gastroenterology;  Laterality: N/A;   FRACTURE SURGERY  04/21/2006   ankle   PARTIAL HYSTERECTOMY  02/02/1994   for fibroids   TONSILLECTOMY  02/02/1962   Family History  Problem Relation Age of Onset   Stroke Mother    Osteoporosis Mother    Heart disease Mother    Cancer Father    Heart attack Maternal Grandfather    Heart attack Paternal Grandfather    Osteoporosis Other        grandmother   Social History   Socioeconomic History   Marital status: Single    Spouse name: Not on file   Number of children: Not on file   Years of education: Not on file   Highest education level: Not on file  Occupational History   Not on file  Tobacco Use   Smoking status: Never   Smokeless tobacco: Not on file  Vaping Use   Vaping status: Never Used  Substance and Sexual Activity   Alcohol use: Not Currently    Comment: social   Drug use: No   Sexual activity: Not Currently    Birth control/protection: Post-menopausal  Other Topics Concern   Not on  file  Social History Narrative   Not on file   Social Determinants of Health   Financial Resource Strain: Low Risk  (09/30/2022)   Overall Financial Resource Strain (CARDIA)    Difficulty of Paying Living Expenses: Not hard at all  Food Insecurity: No Food Insecurity (09/30/2022)   Hunger Vital Sign    Worried About Running Out of Food in the Last Year: Never true    Ran Out of Food in the Last Year: Never true  Transportation Needs: No Transportation Needs (09/30/2022)   PRAPARE - Administrator, Civil Service (Medical): No    Lack of Transportation (Non-Medical): No  Physical Activity: Insufficiently Active (09/30/2022)   Exercise Vital Sign     Days of Exercise per Week: 2 days    Minutes of Exercise per Session: 20 min  Stress: No Stress Concern Present (09/30/2022)   [employer] of Occupational Health - Occupational Stress Questionnaire    Feeling of Stress : Not at all  Social Connections: Unknown (09/30/2022)   Social Connection and Isolation Panel [NHANES]    Frequency of Communication with Friends and Family: Once a week    Frequency of Social Gatherings with Friends and Family: Once a week    Attends Religious Services: Not on Marketing executive or Organizations: Yes    Attends Engineer, structural: More than 4 times per year    Marital Status: Never married    Tobacco Counseling Counseling given: Not Answered   Clinical Intake:  Pre-visit preparation completed: No  Pain : No/denies pain     BMI - recorded: 44.26 Nutritional Risks: None Diabetes: No  How often do you need to have someone help you when you read instructions, pamphlets, or other written materials from your doctor or pharmacy?: 1 - Never  Interpreter Needed?: No  Comments: father lives her Information entered by :: B.Tilton Marsalis,LPN   Activities of Daily Living    09/30/2022    3:11 PM 03/25/2022    1:40 PM  In your present state of health, do you have any difficulty performing the following activities:  Hearing? 0 0  Vision? 0 0  Difficulty concentrating or making decisions? 0 0  Walking or climbing stairs? 0 0  Dressing or bathing? 0 0  Doing errands, shopping? 0 0  Preparing Food and eating ? N   Using the Toilet? N   In the past six months, have you accidently leaked urine? N   Do you have problems with loss of bowel control? N   Managing your Medications? N   Managing your Finances? N   Housekeeping or managing your Housekeeping? N     Patient Care Team: Sherlyn Hay, DO as PCP - General (Family Medicine)  Indicate any recent Medical Services you may have received from other than [facility] providers  in the past year (date may be approximate).     Assessment:   This is a routine wellness examination for [place].  Hearing/Vision screen Hearing Screening - Comments:: Adequate hearing  Vision Screening - Comments:: Adequate vision   Dietary issues and exercise activities discussed:     Goals Addressed             This Visit's Progress    Weight (lb) < 200 lb (90.7 kg)   242 lb (109.8 kg)      Depression Screen    10/06/2022    3:09 PM 03/25/2022    1:39 PM  PHQ 2/9 Scores  PHQ - 2 Score 0 0  PHQ- 9 Score  0    Fall Risk    09/30/2022    3:11 PM 03/25/2022    1:39 PM  Fall Risk   Falls in the past year? 0 0  Number falls in past yr:  0  Injury with Fall?  0  Risk for fall due to : No Fall Risks   Follow up Education provided;Falls prevention discussed     MEDICARE RISK AT HOME: Medicare Risk at Home Any stairs in or around the home?: Yes If so, are there any without handrails?: No Home free of loose throw rugs in walkways, pet beds, electrical cords, etc?: Yes Adequate lighting in your home to reduce risk of falls?: Yes Life alert?: No Use of a cane, walker or w/c?: No Grab bars in the bathroom?: No Shower chair or bench in shower?: No Elevated toilet seat or a handicapped toilet?: No  TIMED UP AND GO:  Was the test performed? No    Cognitive Function:        10/06/2022    3:12 PM  6CIT Screen  What Year? 0 points  What month? 0 points  What time? 0 points  Count back from 20 0 points  Months in reverse 0 points  Repeat phrase 0 points  Total Score 0 points    Immunizations Immunization History  Administered Date(s) Administered   Influenza,inj,Quad PF,6+ Mos 11/04/2014   Influenza-Unspecified 11/10/2012, 11/09/2013, 10/18/2019, 10/27/2021   PFIZER(Purple Top)SARS-COV-2 Vaccination 06/09/2019, 07/04/2019   PNEUMOCOCCAL CONJUGATE-20 03/25/2022   Respiratory Syncytial Virus Vaccine,Recomb Aduvanted(Arexvy) 05/14/2022   Td 02/03/2004    Zoster Recombinant(Shingrix) 05/14/2022    TDAP status: Up to date  Flu Vaccine status: Up to date  Pneumococcal vaccine status: Up to date  Covid-19 vaccine status: Completed vaccines  Qualifies for Shingles Vaccine? Yes   Zostavax completed Yes  both doses 2022   Screening Tests Health Maintenance  Topic Date Due   HIV Screening  Never done   Hepatitis C Screening  Never done   DTaP/Tdap/Td (2 - Tdap) 02/02/2014   COVID-19 Vaccine (3 - Pfizer risk series) 08/01/2019   Zoster Vaccines- Shingrix (2 of 2) 07/09/2022   INFLUENZA VACCINE  09/03/2022   MAMMOGRAM  05/13/2023   Medicare Annual Wellness (AWV)  10/06/2023   Colonoscopy  05/28/2025   Pneumonia Vaccine 61+ Years old  Completed   DEXA SCAN  Completed   HPV VACCINES  Aged Out   PAP SMEAR-Modifier  Discontinued    Health Maintenance  Health Maintenance Due  Topic Date Due   HIV Screening  Never done   Hepatitis C Screening  Never done   DTaP/Tdap/Td (2 - Tdap) 02/02/2014   COVID-19 Vaccine (3 - Pfizer risk series) 08/01/2019   Zoster Vaccines- Shingrix (2 of 2) 07/09/2022   INFLUENZA VACCINE  09/03/2022    Colorectal cancer screening: Type of screening: Colonoscopy. Completed yes. Repeat every 5-10 years  Mammogram status: Completed yes. Repeat every year  Bone Density status: Completed yes. Results reflect: Bone density results: NORMAL. Repeat every yes years.  Lung Cancer Screening: (Low Dose CT Chest recommended if Age 58-80 years, 20 pack-year currently smoking OR have quit w/in 15years.) does not qualify.   Lung Cancer Screening Referral: yes  Additional Screening:  Hepatitis C Screening: does not qualify; Completed yes  Vision Screening: Recommended annual ophthalmology exams for early detection of glaucoma and other disorders of the eye. Is the patient up  to date with their annual eye exam?  Yes  Who is the provider or what is the name of the office in which the patient attends annual eye exams?  [facility] If pt is not established with a provider, would they like to be referred to a provider to establish care? No .   Dental Screening: Recommended annual dental exams for proper oral hygiene  Diabetic Foot Exam: n/a  Community Resource Referral / Chronic Care Management: CRR required this visit?  No   CCM required this visit?  No    Plan:     I have personally reviewed and noted the following in the patient's chart:   Medical and social history Use of alcohol, tobacco or illicit drugs  Current medications and supplements including opioid prescriptions. Patient is not currently taking opioid prescriptions. Functional ability and status Nutritional status Physical activity Advanced directives List of other physicians Hospitalizations, surgeries, and ER visits in previous 12 months Vitals Screenings to include cognitive, depression, and falls Referrals and appointments  In addition, I have reviewed and discussed with patient certain preventive protocols, quality metrics, and best practice recommendations. A written personalized care plan for preventive services as well as general preventive health recommendations were provided to patient.    Sue Lush, LPN   03/11/2534   After Visit Summary: (MyChart) Due to this being a telephonic visit, the after visit summary with patients personalized plan was offered to patient via MyChart   Nurse Notes: Pt expresses her desire to lose weight and inquires about pills or injections to assist with this. Pt says she will discuss at initial visit with PCP (she will make very soon). She declined to schedule that at this time.     I have reviewed the health advisor's note, was available for consultation, and agree with documentation and plan  Despina Arias [facility] 10/15/2022 11:37 PM

## 2022-10-06 NOTE — Patient Instructions (Signed)
Gabriella Lawson , Thank you for taking time to come for your Medicare Wellness Visit. I appreciate your ongoing commitment to your health goals. Please review the following plan we discussed and let me know if I can assist you in the future.   Referrals/Orders/Follow-Ups/Clinician Recommendations: none  This is a list of the screening recommended for you and due dates:  Health Maintenance  Topic Date Due   HIV Screening  Never done   Hepatitis C Screening  Never done   DTaP/Tdap/Td vaccine (2 - Tdap) 02/02/2014   COVID-19 Vaccine (3 - Pfizer risk series) 08/01/2019   Zoster (Shingles) Vaccine (2 of 2) 07/09/2022   Flu Shot  09/03/2022   Mammogram  05/13/2023   Medicare Annual Wellness Visit  10/06/2023   Colon Cancer Screening  05/28/2025   Pneumonia Vaccine  Completed   DEXA scan (bone density measurement)  Completed   HPV Vaccine  Aged Out   Pap Smear  Discontinued    Advanced directives: (Copy Requested) Please bring a copy of your health care power of attorney and living will to the office to be added to your chart at your convenience.  Next Medicare Annual Wellness Visit scheduled for next year: Yes 10/12/23 @ 2:30pm telephone

## 2023-04-07 ENCOUNTER — Ambulatory Visit (INDEPENDENT_AMBULATORY_CARE_PROVIDER_SITE_OTHER): Payer: Medicare Other | Admitting: Gastroenterology

## 2023-04-07 ENCOUNTER — Encounter: Payer: Self-pay | Admitting: Gastroenterology

## 2023-04-07 VITALS — BP 145/81 | HR 97 | Temp 98.2°F | Ht 61.0 in | Wt 247.0 lb

## 2023-04-07 DIAGNOSIS — K643 Fourth degree hemorrhoids: Secondary | ICD-10-CM

## 2023-04-07 NOTE — Progress Notes (Signed)
 PROCEDURE NOTE: The patient presents with symptomatic grade 4 hemorrhoids, unresponsive to maximal medical therapy, requesting rubber band ligation of his/her hemorrhoidal disease.  All risks, benefits and alternative forms of therapy were described and informed consent was obtained.  The decision was made to repeat banding on the LL internal hemorrhoid, and the [facility] System was used to perform band ligation without complication.  Digital anorectal examination was then performed to assure proper positioning of the band, and to adjust the banded tissue as required.  The patient was discharged home without pain or other issues.  Dietary and behavioral recommendations were given and (if necessary - prescriptions were given), along with follow-up instructions.  The patient will return 3 weeks for follow-up and possible additional banding as required.  No complications were encountered and the patient tolerated the procedure well.

## 2023-05-20 ENCOUNTER — Ambulatory Visit: Payer: Medicare Other | Admitting: Gastroenterology

## 2023-05-25 ENCOUNTER — Ambulatory Visit (INDEPENDENT_AMBULATORY_CARE_PROVIDER_SITE_OTHER): Admitting: Gastroenterology

## 2023-05-25 ENCOUNTER — Encounter: Payer: Self-pay | Admitting: Gastroenterology

## 2023-05-25 VITALS — BP 136/84 | HR 88 | Temp 97.7°F | Ht 61.0 in | Wt 246.1 lb

## 2023-05-25 DIAGNOSIS — K643 Fourth degree hemorrhoids: Secondary | ICD-10-CM

## 2023-05-25 NOTE — Progress Notes (Signed)
 PROCEDURE NOTE: The patient presents with symptomatic grade 4 hemorrhoids, unresponsive to maximal medical therapy, requesting rubber band ligation of his/her hemorrhoidal disease.  All risks, benefits and alternative forms of therapy were described and informed consent was obtained.  The decision was made to repeat banding on the RP internal hemorrhoid, and the [facility] System was used to perform band ligation without complication.  Digital anorectal examination was then performed to assure proper positioning of the band, and to adjust the banded tissue as required.  The patient was discharged home without pain or other issues.  Dietary and behavioral recommendations were given and (if necessary - prescriptions were given), along with follow-up instructions.  The patient will return 6 weeks for follow-up and possible additional banding as required.  No complications were encountered and the patient tolerated the procedure well.

## 2023-07-13 ENCOUNTER — Encounter: Payer: Self-pay | Admitting: Family Medicine

## 2023-07-13 ENCOUNTER — Ambulatory Visit: Admitting: Family Medicine

## 2023-07-13 VITALS — BP 143/69 | HR 77 | Resp 16 | Ht 64.0 in | Wt 239.3 lb

## 2023-07-13 DIAGNOSIS — E782 Mixed hyperlipidemia: Secondary | ICD-10-CM | POA: Diagnosis not present

## 2023-07-13 DIAGNOSIS — R7309 Other abnormal glucose: Secondary | ICD-10-CM

## 2023-07-13 DIAGNOSIS — Z130 Encounter for screening for diseases of the blood and blood-forming organs and certain disorders involving the immune mechanism: Secondary | ICD-10-CM

## 2023-07-13 DIAGNOSIS — R030 Elevated blood-pressure reading, without diagnosis of hypertension: Secondary | ICD-10-CM

## 2023-07-13 DIAGNOSIS — Z1159 Encounter for screening for other viral diseases: Secondary | ICD-10-CM

## 2023-07-13 DIAGNOSIS — K644 Residual hemorrhoidal skin tags: Secondary | ICD-10-CM | POA: Diagnosis not present

## 2023-07-13 DIAGNOSIS — Z1231 Encounter for screening mammogram for malignant neoplasm of breast: Secondary | ICD-10-CM

## 2023-07-13 DIAGNOSIS — Z0000 Encounter for general adult medical examination without abnormal findings: Secondary | ICD-10-CM

## 2023-07-13 NOTE — Patient Instructions (Signed)

## 2023-07-13 NOTE — Progress Notes (Signed)
 Complete physical exam   Patient: Gabriella Lawson   DOB: 12/12/1955   68 y.o. Female  MRN: [id number] Visit Date: 07/13/2023  Today's healthcare provider: Carlean Charter, DO   Chief Complaint  Patient presents with   Transitions Of Care    Former patient of Trenton Frock. Patient had AWV done 10/06/2022 with [facility] Last mammogram: 05/14/2022 NL Discuss referral to Mammogram if needed Discuss does she need a new covid vaccine, Hepatitis C   Subjective    Gabriella Lawson is a 68 y.o. female who presents today for a complete physical exam.  She reports consuming a general well-balanced diet; she reports she has been eating well and reducing portions. The patient does not participate in regular exercise at present. She generally feels well. She reports sleeping well. She does not have additional problems to discuss today.   HPI HPI     Transitions Of Care    Additional comments: Former patient of Trenton Frock. Patient had AWV done 10/06/2022 with [facility] Last mammogram: 05/14/2022 NL Discuss referral to Mammogram if needed Discuss does she need a new covid vaccine, Hepatitis C      Last edited by Selinda Dales, CMA on 07/13/2023 10:46 AM.       Gabriella Lawson is a 68 year old female who presents for a routine follow-up visit.  She has a history of hemorrhoids and is under the care of a gastroenterologist. She has made dietary changes to manage this condition and experiences no diarrhea or constipation. She discontinued baby aspirin due to this condition.  She has mildly elevated cholesterol, which she attributes to her diet and lack of exercise. She is working on reducing portion sizes and has lost a few pounds since May. She does not take any medications for cholesterol.  She has had elevated A1c levels in the past, with the last noted elevation in 2015.   She has had COVID once and receives annual flu shots.  She is postmenopausal and experiences no vaginal bleeding or  discharge, hot flashes, or other menopausal symptoms.  She has steel plating screws in her right ankle, which sometimes appears larger than the other but otherwise denies lower extremity swelling.  She lives with her 89 year old father, whom she takes care of, and spends time with her 60-year-old great niece. She does not exercise as much as she should but engages in activities with her niece. She is working on improving her diet by reducing portion sizes and cutting back on sweets and potato chips.    Past Medical History:  Diagnosis Date   Allergy    Etodolac   H/O osteopenia    mild   History of IBS    managed with diet (chronic constipation)   History of uterine fibroid    had systerectomy   Past Surgical History:  Procedure Laterality Date   ABDOMINAL HYSTERECTOMY     ANKLE FRACTURE SURGERY     steel plate/7 screws (fx in 3 places adn dislocated)   BREAST BIOPSY  02/02/1994   COLONOSCOPY WITH PROPOFOL  N/A 05/29/2022   Procedure: COLONOSCOPY WITH PROPOFOL ;  Surgeon: Selena Daily, MD;  Location: [facility];  Service: Gastroenterology;  Laterality: N/A;   FRACTURE SURGERY  04/21/2006   ankle   PARTIAL HYSTERECTOMY  02/02/1994   for fibroids   TONSILLECTOMY  02/02/1962   Social History   Socioeconomic History   Marital status: Single    Spouse name: Not on file  Number of children: Not on file   Years of education: Not on file   Highest education level: Bachelor's degree (e.g., BA, AB, BS)  Occupational History   Not on file  Tobacco Use   Smoking status: Never   Smokeless tobacco: Never  Vaping Use   Vaping status: Never Used  Substance and Sexual Activity   Alcohol use: Not Currently    Comment: social   Drug use: No   Sexual activity: Not Currently    Birth control/protection: Post-menopausal  Other Topics Concern   Not on file  Social History Narrative   Not on file   Social Drivers of Health   Financial Resource Strain: Low Risk   (07/13/2023)   Overall Financial Resource Strain (CARDIA)    Difficulty of Paying Living Expenses: Not hard at all  Food Insecurity: No Food Insecurity (07/13/2023)   Hunger Vital Sign    Worried About Running Out of Food in the Last Year: Never true    Ran Out of Food in the Last Year: Never true  Transportation Needs: No Transportation Needs (07/13/2023)   PRAPARE - Administrator, Civil Service (Medical): No    Lack of Transportation (Non-Medical): No  Physical Activity: Insufficiently Active (07/13/2023)   Exercise Vital Sign    Days of Exercise per Week: 1 day    Minutes of Exercise per Session: 10 min  Stress: No Stress Concern Present (07/13/2023)   [employer] of Occupational Health - Occupational Stress Questionnaire    Feeling of Stress : Not at all  Social Connections: Moderately Integrated (07/13/2023)   Social Connection and Isolation Panel [NHANES]    Frequency of Communication with Friends and Family: Once a week    Frequency of Social Gatherings with Friends and Family: Twice a week    Attends Religious Services: More than 4 times per year    Active Member of [employer] or Organizations: Yes    Attends Banker Meetings: More than 4 times per year    Marital Status: Never married  Intimate Partner Violence: Not At Risk (07/13/2023)   Humiliation, Afraid, Rape, and Kick questionnaire    Fear of Current or Ex-Partner: No    Emotionally Abused: No    Physically Abused: No    Sexually Abused: No   Family Status  Relation Name Status   Mother Sherrina Zaugg (Not Specified)   Father Drew Herman (Not Specified)   MGF  (Not Specified)   PGF  (Not Specified)   Other  (Not Specified)  No partnership data on file   Family History  Problem Relation Age of Onset   Stroke Mother    Osteoporosis Mother    Heart disease Mother    Cancer Father    Heart attack Maternal Grandfather    Heart attack Paternal Grandfather    Osteoporosis Other         grandmother   Allergies  Allergen Reactions   Etodolac Rash    Red skin that burned    Patient Care Team: Carlean Charter, DO as PCP - General (Family Medicine)   Medications: No outpatient medications prior to visit.   No facility-administered medications prior to visit.    Review of Systems  Constitutional:  Negative for chills, fatigue and fever.  HENT:  Negative for congestion, ear pain, rhinorrhea, sneezing and sore throat.   Eyes: Negative.  Negative for pain and redness.  Respiratory:  Negative for cough, shortness of breath and wheezing.  Cardiovascular:  Negative for chest pain and leg swelling.  Gastrointestinal:  Negative for abdominal pain, blood in stool, constipation, diarrhea and nausea.  Endocrine: Negative for polydipsia and polyphagia.  Genitourinary: Negative.  Negative for dysuria, flank pain, hematuria, pelvic pain, vaginal bleeding and vaginal discharge.  Musculoskeletal:  Negative for arthralgias, back pain, gait problem and joint swelling.  Skin:  Negative for rash.  Neurological: Negative.  Negative for dizziness, tremors, seizures, weakness, light-headedness, numbness and headaches.  Hematological:  Negative for adenopathy.  Psychiatric/Behavioral: Negative.  Negative for behavioral problems, confusion and dysphoric mood. The patient is not nervous/anxious and is not hyperactive.       Objective    BP (!) 143/69 (BP Location: Left Arm, Patient Position: Sitting, Cuff Size: Large)   Pulse 77   Resp 16   Ht 5\' 4"  (1.626 m)   Wt 239 lb 4.8 oz (108.5 kg)   SpO2 99%   BMI 41.08 kg/m    Physical Exam Vitals and nursing note reviewed.  Constitutional:      General: She is awake.     Appearance: Normal appearance.  HENT:     Head: Normocephalic and atraumatic.     Right Ear: Tympanic membrane, ear canal and external ear normal.     Left Ear: Tympanic membrane, ear canal and external ear normal.     Nose: Nose normal.     Mouth/Throat:      Mouth: Mucous membranes are moist.     Pharynx: Oropharynx is clear. No oropharyngeal exudate or posterior oropharyngeal erythema.  Eyes:     General: No scleral icterus.    Extraocular Movements: Extraocular movements intact.     Conjunctiva/sclera: Conjunctivae normal.     Pupils: Pupils are equal, round, and reactive to light.  Neck:     Thyroid: No thyromegaly or thyroid tenderness.  Cardiovascular:     Rate and Rhythm: Normal rate and regular rhythm.     Pulses: Normal pulses.     Heart sounds: Normal heart sounds.  Pulmonary:     Effort: Pulmonary effort is normal. No tachypnea, bradypnea or respiratory distress.     Breath sounds: Normal breath sounds. No stridor. No wheezing, rhonchi or rales.  Abdominal:     General: Bowel sounds are normal. There is no distension.     Palpations: Abdomen is soft. There is no mass.     Tenderness: There is no abdominal tenderness. There is no guarding.     Hernia: No hernia is present.  Musculoskeletal:     Cervical back: Normal range of motion and neck supple.     Right lower leg: No edema.     Left lower leg: No edema.  Lymphadenopathy:     Cervical: No cervical adenopathy.  Skin:    General: Skin is warm and dry.     Comments: +varicose vein to medial left knee and spider veins noted to anterior left shin  Neurological:     Mental Status: She is alert and oriented to person, place, and time. Mental status is at baseline.  Psychiatric:        Mood and Affect: Mood normal.        Behavior: Behavior normal.      Last depression screening scores    07/13/2023   10:43 AM 10/06/2022    3:09 PM 03/25/2022    1:39 PM  PHQ 2/9 Scores  PHQ - 2 Score 0 0 0  PHQ- 9 Score 0  0   Last  fall risk screening    07/13/2023   10:43 AM  Fall Risk   Falls in the past year? 0  Injury with Fall? 0  Risk for fall due to : No Fall Risks   Last Audit-C alcohol use screening    07/13/2023   10:42 AM  Alcohol Use Disorder Test (AUDIT)  1. How  often do you have a drink containing alcohol? 0  2. How many drinks containing alcohol do you have on a typical day when you are drinking? 0  3. How often do you have six or more drinks on one occasion? 0  AUDIT-C Score 0   A score of 3 or more in women, and 4 or more in men indicates increased risk for alcohol abuse, EXCEPT if all of the points are from question 1   No results found for any visits on 07/13/23.  Assessment & Plan    Routine Health Maintenance and Physical Exam  Exercise Activities and Dietary recommendations  Goals      Weight (lb) < 200 lb (90.7 kg)        Immunization History  Administered Date(s) Administered   Influenza, High Dose Seasonal PF 11/05/2022   Influenza,inj,Quad PF,6+ Mos 11/04/2014   Influenza-Unspecified 11/10/2012, 11/09/2013, 10/18/2019, 10/27/2021   PFIZER Comirnaty(Gray Top)Covid-19 Tri-Sucrose Vaccine 05/22/2020   PFIZER(Purple Top)SARS-COV-2 Vaccination 06/09/2019, 07/04/2019, 01/11/2020   PNEUMOCOCCAL CONJUGATE-20 03/25/2022   Pfizer Covid-19 Vaccine Bivalent Booster 22yrs & up 11/13/2020   Pfizer(Comirnaty)Fall Seasonal Vaccine 12 years and older 11/16/2022   Respiratory Syncytial Virus Vaccine,Recomb Aduvanted(Arexvy) 05/14/2022   Td 02/03/2004   Tdap 11/05/2022   Zoster Recombinant(Shingrix) 05/14/2022    Health Maintenance  Topic Date Due   Hepatitis C Screening  Never done   Zoster Vaccines- Shingrix (2 of 2) 07/09/2022   MAMMOGRAM  05/13/2023   COVID-19 Vaccine (7 - Pfizer risk 2024-25 season) 05/17/2023   INFLUENZA VACCINE  09/03/2023   Medicare Annual Wellness (AWV)  10/06/2023   Colonoscopy  05/28/2025   DTaP/Tdap/Td (3 - Td or Tdap) 11/04/2032   Pneumonia Vaccine 53+ Years old  Completed   DEXA SCAN  Completed   HPV VACCINES  Aged Out   Meningococcal B Vaccine  Aged Out    Discussed health benefits of physical activity, and encouraged her to engage in regular exercise appropriate for her age and  condition.   Annual physical exam  Elevated blood pressure reading  Obesity, morbid (HCC)  Moderate mixed hyperlipidemia not requiring statin therapy -     Comprehensive metabolic panel with GFR -     Lipid panel  External hemorrhoids  Elevated hemoglobin A1c -     Hemoglobin A1c  Screening for endocrine, metabolic and immunity disorder -     Comprehensive metabolic panel with GFR  Encounter for screening mammogram for breast cancer -     3D Screening Mammogram, Left and Right; Future  Encounter for hepatitis C screening test for low risk patient -     HCV Ab w Reflex to Quant PCR      Annual physical exam Physical exam overall unremarkable except as noted above. Routine lab work ordered as noted. Good general health with recent wellness visit. Mammogram and bone density scan performed last year. COVID-19 vaccination up to date. Discussion on hepatitis C screening. - Screen for hepatitis C. - Advise annual flu vaccinations. - Discuss need for COVID-19 booster.  Elevated blood pressure without diagnosis of hypertension Blood pressure at 143 mmHg systolic, possible influenced by anxiety  during the visit. No antihypertensive medication prescribed. - Advise obtaining a home blood pressure cuff or using public machines for regular monitoring. - Instruct to maintain a blood pressure log for the next follow-up. - Encourage lifestyle modifications including increased physical activity and dietary adjustments.  Morbid obesity Congratulated patient working to reduce portion sizes.  Counseled patient on lifestyle modifications, especially increasing activity she reports a fairly sedentary lifestyle.  Hyperlipidemia Mildly elevated cholesterol levels with dietary factors and lack of exercise as contributors. Fasting lipid panel recommended. - Order fasting lipid panel. - Encourage dietary modifications and increased physical activity.  Hemorrhoids Chronic hemorrhoids managed  by a gastroenterologist. No acute symptoms reported. - Continue management with gastroenterologist. - Maintain dietary modifications.    Follow-up Plans to monitor blood pressure and cholesterol levels. - Schedule follow-up in 3 to 6 months to review blood pressure log and reassess hypertension management. - Return for fasting blood work between 8 AM and 11:30 AM on a weekday.  Return in about 3 months (around 10/13/2023) for BP.     I discussed the assessment and treatment plan with the patient  The patient was provided an opportunity to ask questions and all were answered. The patient agreed with the plan and demonstrated an understanding of the instructions.   The patient was advised to call back or seek an in-person evaluation if the symptoms worsen or if the condition fails to improve as anticipated.    Carlean Charter, DO  [facility] [phone] (phone) [phone] (fax)  [facility]

## 2023-07-22 ENCOUNTER — Ambulatory Visit
Admission: RE | Admit: 2023-07-22 | Discharge: 2023-07-22 | Disposition: A | Discharge status: Home / Self Care | Source: Ambulatory Visit | Attending: Family Medicine | Admitting: Family Medicine

## 2023-07-22 DIAGNOSIS — Z1231 Encounter for screening mammogram for malignant neoplasm of breast: Secondary | ICD-10-CM | POA: Diagnosis present

## 2023-07-23 LAB — COMPREHENSIVE METABOLIC PANEL WITH GFR
ALT: 21 IU/L (ref 0–32)
AST: 14 IU/L (ref 0–40)
Albumin: 4.3 g/dL (ref 3.9–4.9)
Alkaline Phosphatase: 102 IU/L (ref 44–121)
BUN/Creatinine Ratio: 15 (ref 12–28)
BUN: 13 mg/dL (ref 8–27)
Bilirubin Total: 0.4 mg/dL (ref 0.0–1.2)
CO2: 21 mmol/L (ref 20–29)
Calcium: 9.4 mg/dL (ref 8.7–10.3)
Chloride: 105 mmol/L (ref 96–106)
Creatinine, Ser: 0.84 mg/dL (ref 0.57–1.00)
Globulin, Total: 2.2 g/dL (ref 1.5–4.5)
Glucose: 105 mg/dL — ABNORMAL HIGH (ref 70–99)
Potassium: 4.7 mmol/L (ref 3.5–5.2)
Sodium: 143 mmol/L (ref 134–144)
Total Protein: 6.5 g/dL (ref 6.0–8.5)
eGFR: 77 mL/min/{1.73_m2} (ref >59)

## 2023-07-23 LAB — LIPID PANEL
Chol/HDL Ratio: 3.3 ratio (ref 0.0–4.4)
Cholesterol, Total: 180 mg/dL (ref 100–199)
HDL: 55 mg/dL (ref >39)
LDL Chol Calc (NIH): 108 mg/dL — ABNORMAL HIGH (ref 0–99)
Triglycerides: 95 mg/dL (ref 0–149)
VLDL Cholesterol Cal: 17 mg/dL (ref 5–40)

## 2023-07-23 LAB — HEMOGLOBIN A1C
Est. average glucose Bld gHb Est-mCnc: 143 mg/dL
Hgb A1c MFr Bld: 6.6 % — ABNORMAL HIGH (ref 4.8–5.6)

## 2023-07-23 LAB — HCV AB W REFLEX TO QUANT PCR: HCV Ab: NONREACTIVE

## 2023-07-23 LAB — HCV INTERPRETATION

## 2023-07-28 ENCOUNTER — Ambulatory Visit: Payer: Self-pay | Admitting: Family Medicine

## 2023-10-12 ENCOUNTER — Ambulatory Visit: Admitting: Family Medicine

## 2023-11-02 ENCOUNTER — Encounter: Payer: Self-pay | Admitting: Family Medicine

## 2023-11-02 ENCOUNTER — Ambulatory Visit: Admitting: Family Medicine

## 2023-11-02 VITALS — BP 134/72 | HR 68 | Temp 98.4°F | Ht 64.0 in | Wt 221.0 lb

## 2023-11-02 DIAGNOSIS — R030 Elevated blood-pressure reading, without diagnosis of hypertension: Secondary | ICD-10-CM

## 2023-11-02 DIAGNOSIS — R7309 Other abnormal glucose: Secondary | ICD-10-CM

## 2023-11-02 DIAGNOSIS — E782 Mixed hyperlipidemia: Secondary | ICD-10-CM

## 2023-11-02 NOTE — Progress Notes (Addendum)
 Established patient visit   Patient: Gabriella Lawson   DOB: Dec 28, 1955   68 y.o. Female  MRN: [id number] Visit Date: 11/02/2023  Today's healthcare provider: LAURAINE LOISE BUOY, DO   Chief Complaint  Patient presents with   Hypertension    Patient does not take any medications.  She has been watching her diet and exercising since her last visit.  She states she has lost some weight.  She also said she thinks she was going to have some blood drawn today.  She is fasting today.  Patient's weight is down 18 pounds since her last visit here.   Subjective    HPI She is a 68 year old female with hypertension and hyperlipidemia who presents for a follow-up visit.  She has been monitoring her blood pressure at home, with readings ranging from 120-130 mmHg, showing improvement from previous readings in the 140s. She attributes some higher readings to nervousness during visits. She has lost 18 pounds since July 13, 2023, and is actively managing her weight through walking and dietary changes.  She is concerned about her cholesterol levels, which were slightly elevated. She is managing this through diet and exercise and is hesitant to start medication unless necessary. She was not fasting during her last cholesterol test and is prepared for a fasting test today.  She has a history of elevated A1c and is following up on her blood work.   She received her COVID vaccine last week and plans to get her flu shot on Thursday. She has completed the shingles vaccine series and is up to date with her pneumonia vaccination.     Medications: No outpatient medications prior to visit.   No facility-administered medications prior to visit.    Review of Systems  Respiratory: Negative.  Negative for cough, shortness of breath and wheezing.   Cardiovascular:  Negative for chest pain, palpitations and leg swelling.  Neurological:  Negative for dizziness, weakness, numbness and headaches.         Objective    BP 134/72 (BP Location: Left Arm, Patient Position: Sitting, Cuff Size: Large)   Pulse 68   Temp 98.4 F (36.9 C) (Oral)   Ht 5' 4 (1.626 m)   Wt 221 lb (100.2 kg)   SpO2 100%   BMI 37.93 kg/m     Physical Exam Constitutional:      Appearance: Normal appearance.  HENT:     Head: Normocephalic and atraumatic.  Eyes:     General: No scleral icterus.    Extraocular Movements: Extraocular movements intact.     Conjunctiva/sclera: Conjunctivae normal.  Cardiovascular:     Rate and Rhythm: Normal rate and regular rhythm.     Pulses: Normal pulses.     Heart sounds: Normal heart sounds.  Pulmonary:     Effort: Pulmonary effort is normal. No respiratory distress.     Breath sounds: Normal breath sounds.  Musculoskeletal:     Right lower leg: No edema.     Left lower leg: No edema.  Skin:    General: Skin is warm and dry.  Neurological:     Mental Status: She is alert and oriented to person, place, and time. Mental status is at baseline.  Psychiatric:        Mood and Affect: Mood normal.        Behavior: Behavior normal.      The 10-year ASCVD risk score (Arnett DK, et al., 2019) is: 7.2%   Values  used to calculate the score:     Age: 75 years     Clincally relevant sex: Female     Is Non-Hispanic African American: No     Diabetic: No     Tobacco smoker: No     Systolic Blood Pressure: 134 mmHg     Is BP treated: No     HDL Cholesterol: 55 mg/dL     Total Cholesterol: 180 mg/dL   No results found for any visits on 11/02/23.  Assessment & Plan    Elevated blood-pressure reading without diagnosis of hypertension  Moderate mixed hyperlipidemia not requiring statin therapy -     Lipid panel  Elevated hemoglobin A1c -     Hemoglobin A1c      Elevated blood pressure reading without diagnosis of hypertension Blood pressure improved to 120-130 mmHg at home. Anxiety may affect clinic readings.  Continue to monitor.  Elevated hemoglobin  A1c Actively managing pre-diabetes with lifestyle changes. Lost 18 pounds since July 13, 2023. - Recheck hemoglobin A1c.  Moderate mixed hyperlipidemia not requiring statin therapy Cholesterol slightly elevated. ASCVD risk score 7.2%, moderate. Prefers diet and exercise over medication. - Order fasting cholesterol panel.  General Health Maintenance Up to date with COVID, shingles, and pneumonia vaccinations. Plans flu shot on Thursday. - Schedule Medicare annual wellness visit. - Receive flu shot on Thursday.    Return in about 3 months (around 02/01/2024) for for mAWV with AWV nurse and in 07/12/2024 or later for CPE.      I discussed the assessment and treatment plan with the patient  The patient was provided an opportunity to ask questions and all were answered. The patient agreed with the plan and demonstrated an understanding of the instructions.   The patient was advised to call back or seek an in-person evaluation if the symptoms worsen or if the condition fails to improve as anticipated.    LAURAINE LOISE BUOY, DO  [facility] [phone] (phone) [phone] (fax)  [facility]

## 2023-11-03 LAB — LIPID PANEL
Chol/HDL Ratio: 3.7 ratio (ref 0.0–4.4)
Cholesterol, Total: 190 mg/dL (ref 100–199)
HDL: 51 mg/dL (ref >39)
LDL Chol Calc (NIH): 120 mg/dL — ABNORMAL HIGH (ref 0–99)
Triglycerides: 108 mg/dL (ref 0–149)
VLDL Cholesterol Cal: 19 mg/dL (ref 5–40)

## 2023-11-03 LAB — HEMOGLOBIN A1C
Est. average glucose Bld gHb Est-mCnc: 134 mg/dL
Hgb A1c MFr Bld: 6.3 % — ABNORMAL HIGH (ref 4.8–5.6)

## 2023-11-09 ENCOUNTER — Ambulatory Visit: Payer: Self-pay | Admitting: Family Medicine

## 2024-02-01 ENCOUNTER — Ambulatory Visit (INDEPENDENT_AMBULATORY_CARE_PROVIDER_SITE_OTHER)

## 2024-02-01 DIAGNOSIS — Z0000 Encounter for general adult medical examination without abnormal findings: Secondary | ICD-10-CM | POA: Diagnosis not present

## 2024-02-01 NOTE — Patient Instructions (Addendum)
 Ms. Youkhana,  Thank you for taking the time for your Medicare Wellness Visit. I appreciate your continued commitment to your health goals. Please review the care plan we discussed, and feel free to reach out if I can assist you further.  Please note that Annual Wellness Visits do not include a physical exam. Some assessments may be limited, especially if the visit was conducted virtually. If needed, we may recommend an in-person follow-up with your provider.  Ongoing Care Seeing your primary care provider every 3 to 6 months helps us  monitor your health and provide consistent, personalized care.   Referrals If a referral was made during today's visit and you haven't received any updates within two weeks, please contact the referred provider directly to check on the status.  Recommended Screenings:  Health Maintenance  Topic Date Due   COVID-19 Vaccine (8 - Pfizer risk 2025-26 season) 04/24/2024   Breast Cancer Screening  07/21/2024   Medicare Annual Wellness Visit  01/31/2025   Colon Cancer Screening  05/28/2025   Osteoporosis screening with Bone Density Scan  05/13/2027   DTaP/Tdap/Td vaccine (3 - Td or Tdap) 11/04/2032   Pneumococcal Vaccine for age over 62  Completed   Flu Shot  Completed   Hepatitis C Screening  Completed   Zoster (Shingles) Vaccine  Completed   Meningitis B Vaccine  Aged Out     Vision: Annual vision screenings are recommended for early detection of glaucoma, cataracts, and diabetic retinopathy. These exams can also reveal signs of chronic conditions such as diabetes and high blood pressure.  Dental: Annual dental screenings help detect early signs of oral cancer, gum disease, and other conditions linked to overall health, including heart disease and diabetes.  Please see the attached documents for additional preventive care recommendations.   NEXT AWV 02/06/25 @ 8:50 AM IN PERSON

## 2024-02-01 NOTE — Progress Notes (Signed)
 "  No chief complaint on file.    Subjective:   Gabriella Lawson is a 68 y.o. female who presents for a Medicare Annual Wellness Visit.  Visit info / Clinical Intake: Medicare Wellness Visit Type:: Subsequent Annual Wellness Visit Persons participating in visit and providing information:: patient Medicare Wellness Visit Mode:: Telephone If telephone:: video declined Since this visit was completed virtually, some vitals may be partially provided or unavailable. Missing vitals are due to the limitations of the virtual format.: Unable to obtain vitals - no equipment If Telephone or Video please confirm:: I connected with patient using audio/video enable telemedicine. I verified patient identity with two identifiers, discussed telehealth limitations, and patient agreed to proceed. Patient Location:: home Provider Location:: office Interpreter Needed?: No Pre-visit prep was completed: yes AWV questionnaire completed by patient prior to visit?: no Living arrangements:: with family/others (LIVES W/ DAD) Patient's Overall Health Status Rating: very good Typical amount of pain: none Does pain affect daily life?: no Are you currently prescribed opioids?: no  Dietary Habits and Nutritional Risks How many meals a day?: 3 Eats fruit and vegetables daily?: yes Most meals are obtained by: preparing own meals In the last 2 weeks, have you had any of the following?: none Diabetic:: no  Functional Status Activities of Daily Living (to include ambulation/medication): Independent Ambulation: Independent Medication Administration: Independent Home Management (perform basic housework or laundry): Independent Manage your own finances?: yes Primary transportation is: driving Concerns about vision?: no *vision screening is required for WTM* ([facility]) Concerns about hearing?: no  Fall Screening Falls in the past year?: 0 Number of falls in past year: 0 Was there an injury with Fall?:  0 Fall Risk Category Calculator: 0 Patient Fall Risk Level: Low Fall Risk  Fall Risk Patient at Risk for Falls Due to: No Fall Risks Fall risk Follow up: Falls evaluation completed; Falls prevention discussed  Home and Transportation Safety: All rugs have non-skid backing?: yes All stairs or steps have railings?: yes Grab bars in the bathtub or shower?: (!) no Have non-skid surface in bathtub or shower?: yes Good home lighting?: yes Regular seat belt use?: yes Hospital stays in the last year:: no  Cognitive Assessment Difficulty concentrating, remembering, or making decisions? : no Will 6CIT or Mini Cog be Completed: yes What year is it?: 0 points What month is it?: 0 points Give patient an address phrase to remember (5 components): [address] [place], [place] About what time is it?: 0 points Count backwards from 20 to 1: 0 points Say the months of the year in reverse: 0 points Repeat the address phrase from earlier: 0 points 6 CIT Score: 0 points  Advance Directives (For Healthcare) Does Patient Have a Medical Advance Directive?: No Would patient like information on creating a medical advance directive?: No - Patient declined  Reviewed/Updated  Reviewed/Updated: Reviewed All (Medical, Surgical, Family, Medications, Allergies, Care Teams, Patient Goals)    Allergies (verified) Etodolac   Current Medications (verified) No outpatient encounter medications on file as of 02/01/2024.   No facility-administered encounter medications on file as of 02/01/2024.    History: Past Medical History:  Diagnosis Date   Allergy    Etodolac   H/O osteopenia    mild   History of IBS    managed with diet (chronic constipation)   History of uterine fibroid    had systerectomy   Past Surgical History:  Procedure Laterality Date   ABDOMINAL HYSTERECTOMY     ANKLE  FRACTURE SURGERY     steel plate/7 screws (fx in 3 places adn dislocated)   BREAST BIOPSY  02/02/1994    COLONOSCOPY WITH PROPOFOL  N/A 05/29/2022   Procedure: COLONOSCOPY WITH PROPOFOL ;  Surgeon: Unk Corinn Skiff, MD;  Location: [facility] ENDOSCOPY;  Service: Gastroenterology;  Laterality: N/A;   FRACTURE SURGERY  04/21/2006   ankle   PARTIAL HYSTERECTOMY  02/02/1994   for fibroids   TONSILLECTOMY  02/02/1962   Family History  Problem Relation Age of Onset   Stroke Mother    Osteoporosis Mother    Heart disease Mother    Cancer Father    Heart attack Maternal Grandfather    Heart attack Paternal Grandfather    Osteoporosis Other        grandmother   Breast cancer Neg Hx    Social History   Occupational History   Not on file  Tobacco Use   Smoking status: Never   Smokeless tobacco: Never  Vaping Use   Vaping status: Never Used  Substance and Sexual Activity   Alcohol use: Not Currently    Comment: social   Drug use: No   Sexual activity: Not Currently    Birth control/protection: Post-menopausal   Tobacco Counseling Counseling given: Not Answered  SDOH Screenings   Food Insecurity: No Food Insecurity (01/26/2024)  Housing: Low Risk (01/26/2024)  Transportation Needs: No Transportation Needs (01/26/2024)  Utilities: Not At Risk (08/16/2023)   Received from [facility]  Alcohol Screen: Low Risk (10/26/2023)  Depression (PHQ2-9): Low Risk (07/13/2023)  Financial Resource Strain: Low Risk (01/26/2024)  Physical Activity: Sufficiently Active (01/26/2024)  Social Connections: Moderately Isolated (01/26/2024)  Stress: No Stress Concern Present (01/26/2024)  Tobacco Use: Low Risk (02/01/2024)  Health Literacy: Adequate Health Literacy (07/13/2023)   See flowsheets for full screening details  Depression Screen PHQ 2 & 9 Depression Scale- Over the past 2 weeks, how often have you been bothered by any of the following problems? Little interest or pleasure in doing things: 0 Feeling down, depressed, or hopeless (PHQ Adolescent also includes...irritable):  0 PHQ-2 Total Score: 0 Trouble falling or staying asleep, or sleeping too much: 0 Feeling tired or having little energy: 0 Poor appetite or overeating (PHQ Adolescent also includes...weight loss): 0 Feeling bad about yourself - or that you are a failure or have let yourself or your family down: 0 Trouble concentrating on things, such as reading the newspaper or watching television (PHQ Adolescent also includes...like school work): 0 Moving or speaking so slowly that other people could have noticed. Or the opposite - being so fidgety or restless that you have been moving around a lot more than usual: 0 Thoughts that you would be better off dead, or of hurting yourself in some way: 0 PHQ-9 Total Score: 0     Goals Addressed             This Visit's Progress    DIET - EAT MORE FRUITS AND VEGETABLES               Objective:    There were no vitals filed for this visit. There is no height or weight on file to calculate BMI.  Hearing/Vision screen Hearing Screening - Comments:: NO AIDS Vision Screening - Comments:: [facility] Immunizations and Health Maintenance Health Maintenance  Topic Date Due   Zoster Vaccines- Shingrix (2 of 2) 07/09/2022   COVID-19 Vaccine (8 - Pfizer risk 2025-26 season) 04/24/2024   Mammogram  07/21/2024   Medicare  Annual Wellness (AWV)  01/31/2025   Colonoscopy  05/28/2025   DTaP/Tdap/Td (3 - Td or Tdap) 11/04/2032   Pneumococcal Vaccine: 50+ Years  Completed   Influenza Vaccine  Completed   Bone Density Scan  Completed   Hepatitis C Screening  Completed   Meningococcal B Vaccine  Aged Out        Assessment/Plan:  This is a routine wellness examination for [place].  Patient Care Team: Donzella Lauraine SAILOR, DO as PCP - General (Family Medicine) [place], [facility])  I have personally reviewed and noted the following in the patients chart:   Medical and social history Use of alcohol, tobacco or illicit drugs  Current  medications and supplements including opioid prescriptions. Functional ability and status Nutritional status Physical activity Advanced directives List of other physicians Hospitalizations, surgeries, and ER visits in previous 12 months Vitals Screenings to include cognitive, depression, and falls Referrals and appointments  No orders of the defined types were placed in this encounter.  In addition, I have reviewed and discussed with patient certain preventive protocols, quality metrics, and best practice recommendations. A written personalized care plan for preventive services as well as general preventive health recommendations were provided to patient.   Jhonnie GORMAN Das, LPN   [id number]   Return in 1 year (on 01/31/2025).  After Visit Summary: (MyChart) Due to this being a telephonic visit, the after visit summary with patients personalized plan was offered to patient via MyChart   Nurse Notes: UTD ON SHOTS; UTD ON MAMMOGRAM, COLONOSCOPY & BDS   "

## 2024-08-01 ENCOUNTER — Encounter: Admitting: Family Medicine

## 2025-02-06 ENCOUNTER — Ambulatory Visit
